# Patient Record
Sex: Female | Born: 1950
Health system: Southern US, Community
[De-identification: ages and names within clinical notes are randomized; demographics above are authoritative.]

## PROBLEM LIST (undated history)

## (undated) DIAGNOSIS — Z8619 Personal history of other infectious and parasitic diseases: Secondary | ICD-10-CM

## (undated) DIAGNOSIS — S92009A Unspecified fracture of unspecified calcaneus, initial encounter for closed fracture: Secondary | ICD-10-CM

## (undated) DIAGNOSIS — D219 Benign neoplasm of connective and other soft tissue, unspecified: Secondary | ICD-10-CM

## (undated) HISTORY — DX: Personal history of other infectious and parasitic diseases: Z86.19

## (undated) HISTORY — DX: Benign neoplasm of connective and other soft tissue, unspecified: D21.9

## (undated) HISTORY — DX: Unspecified fracture of unspecified calcaneus, initial encounter for closed fracture: S92.009A

---

## 1975-03-31 HISTORY — PX: BREAST SURGERY: SHX581

## 1992-03-30 DIAGNOSIS — D219 Benign neoplasm of connective and other soft tissue, unspecified: Secondary | ICD-10-CM

## 1992-03-30 HISTORY — PX: ABDOMINAL HYSTERECTOMY: SHX81

## 1992-03-30 HISTORY — DX: Benign neoplasm of connective and other soft tissue, unspecified: D21.9

## 2000-08-11 ENCOUNTER — Other Ambulatory Visit: Admission: RE | Admit: 2000-08-11 | Discharge: 2000-08-11 | Payer: Self-pay | Admitting: Obstetrics and Gynecology

## 2005-08-31 ENCOUNTER — Emergency Department (HOSPITAL_COMMUNITY): Admission: EM | Admit: 2005-08-31 | Discharge: 2005-08-31 | Payer: Self-pay | Admitting: Emergency Medicine

## 2008-04-18 ENCOUNTER — Ambulatory Visit: Payer: Self-pay | Admitting: Sports Medicine

## 2008-04-18 DIAGNOSIS — M7750 Other enthesopathy of unspecified foot: Secondary | ICD-10-CM

## 2008-04-18 DIAGNOSIS — M2040 Other hammer toe(s) (acquired), unspecified foot: Secondary | ICD-10-CM

## 2010-12-29 HISTORY — PX: COLONOSCOPY: SHX174

## 2010-12-29 LAB — HM COLONOSCOPY: HM Colonoscopy: 3

## 2011-03-31 DIAGNOSIS — S92009A Unspecified fracture of unspecified calcaneus, initial encounter for closed fracture: Secondary | ICD-10-CM

## 2011-03-31 HISTORY — DX: Unspecified fracture of unspecified calcaneus, initial encounter for closed fracture: S92.009A

## 2012-02-03 ENCOUNTER — Ambulatory Visit (INDEPENDENT_AMBULATORY_CARE_PROVIDER_SITE_OTHER): Payer: BC Managed Care – PPO | Admitting: Sports Medicine

## 2012-02-03 ENCOUNTER — Encounter: Payer: Self-pay | Admitting: Sports Medicine

## 2012-02-03 VITALS — BP 124/75 | HR 72 | Ht 63.5 in | Wt 127.0 lb

## 2012-02-03 DIAGNOSIS — M7750 Other enthesopathy of unspecified foot: Secondary | ICD-10-CM

## 2012-02-03 DIAGNOSIS — M7670 Peroneal tendinitis, unspecified leg: Secondary | ICD-10-CM | POA: Insufficient documentation

## 2012-02-03 NOTE — Patient Instructions (Addendum)
Very nice to meet you. I think the compression on the ankle is still a good idea. When you have is peroneal tendinitis. I would like you to do the exercises I am giving you one times daily at least. Icing 20 minutes 3 times a day. Take ibuprofen 600 mg 3 times a day for 5 days. This will decrease inflammation. I'm also giving you a sports insoles with a heel lift. Wear this whenever you can. Come back in 6-8 weeks if we're not having much improvement.

## 2012-02-03 NOTE — Progress Notes (Signed)
History of present illness Very pleasant 61 year old female who is coming in with right ankle pain. Patient states that she did notice this pain after she did a very long 20-40 mile walk in the month of September. She had seem to be improving when she took some time off but then when she started walking more as well as playing tennis on a more regular basis she started having pain again. Patient states that the pain is on the lateral aspect of the ankle and is very sore with certain activities. Patient also noticed it significantly in the morning when she feels like it is very tight. Patient denies any popping cracking or any true injury to the area. Patient denies any numbness but states that sometimes the foot feels weak. No fevers no chills no radiation of the pain. Patient has not had this injury before but has injured this ankle previously.   Past medical history is unremarkable Past surgical history is unremarkable Allergies: No known drug allergies Medications: None Family history is significant for coronary artery disease as well as hypertension. Social history: She does not smoke occasional line with dinner she works as an Pensions consultant. She is married to [employer].   Physical exam Blood pressure 124/75, pulse 72, height 5' 3.5" (1.613 m), weight 127 lb (57.607 kg). General: No apparent distress alert and oriented x3 mood and affect normal Skin: Clean dry intact no signs of infection or rash Respiratory: Patient speaks in full sentences does not appear short of breath Neuro: Cranial nerves II through XII are intact, nervous intact in all extremities and 2+ DTRs. Right ankle exam: Patient has full range of motion of her ankle with no crepitus or pain. Patient though is very tender to palpation over the peroneal tendons on the right side just inferior to the lateral malleolus. There is no signs of subluxation. Patient does not have any effusion or edema in this area. She is neurovascularly intact  distally. Patient has no pain over the lateral and medial malleolus or the base of the fifth metatarsal. Out of 5 strength of the ankle.  Ultrasound was done and interpreted by me today. Patient's ultrasound does show some hypoechoic changes surrounding the peroneal tendons on the right side. There is no true tear or neovascularization occurring within these tendons. The tendon is able to be traced to the insertion with there is no tear or hypoechoic changes. The only area that seems to be inflamed is inferior to the lateral malleolus.

## 2012-02-03 NOTE — Assessment & Plan Note (Addendum)
Patient on clinical exam, history as well as ultrasound today has findings consistent with peroneal tendinitis. Patient's is going to wear a  X-cross body helix compression on her ankle with activity. Patient given home exercise program to do a regular basis. Patient given sports insoles with heel lift to decrease the amount of stress on her peroneals. Patient will take anti-inflammatories on a scheduled basis for the next 5 days then as needed thereafter. Patient will try all of these interventions as well as icing protocol for the next 4 weeks. If for some reason she still has pain at that time she will come back for further evaluation.

## 2012-05-09 ENCOUNTER — Encounter: Payer: Self-pay | Admitting: Obstetrics and Gynecology

## 2012-06-24 ENCOUNTER — Ambulatory Visit: Payer: Self-pay | Admitting: Obstetrics and Gynecology

## 2012-08-05 ENCOUNTER — Ambulatory Visit: Payer: Self-pay | Admitting: Obstetrics and Gynecology

## 2012-08-05 ENCOUNTER — Encounter: Payer: Self-pay | Admitting: *Deleted

## 2012-08-09 ENCOUNTER — Encounter: Payer: Self-pay | Admitting: Obstetrics and Gynecology

## 2012-08-09 ENCOUNTER — Ambulatory Visit (INDEPENDENT_AMBULATORY_CARE_PROVIDER_SITE_OTHER): Payer: BC Managed Care – PPO | Admitting: Obstetrics and Gynecology

## 2012-08-09 VITALS — BP 112/70 | Ht 62.25 in | Wt 125.0 lb

## 2012-08-09 DIAGNOSIS — Z01419 Encounter for gynecological examination (general) (routine) without abnormal findings: Secondary | ICD-10-CM

## 2012-08-09 DIAGNOSIS — Z0000 Encounter for general adult medical examination without abnormal findings: Secondary | ICD-10-CM

## 2012-08-09 LAB — POCT URINALYSIS DIPSTICK
Nitrite, UA: NEGATIVE
Protein, UA: NEGATIVE
pH, UA: 5

## 2012-08-09 LAB — LIPID PANEL
Total CHOL/HDL Ratio: 2.7 Ratio
Triglycerides: 67 mg/dL (ref <150)

## 2012-08-09 MED ORDER — ESTRADIOL 10 MCG VA TABS: 1.0000 | ORAL_TABLET | VAGINAL | Status: DC

## 2012-08-09 NOTE — Patient Instructions (Addendum)

## 2012-08-09 NOTE — Progress Notes (Signed)
62 y.o.  Married  Caucasian female   G2P2 here for annual exam.    Patient's last menstrual period was 03/30/1992.          Sexually active: yes  The current method of family planning is status post hysterectomy.    Exercising: fitness center weight training, tennis, walk 5 days a week Last mammogram:  05/31/12 neg Last pap smear:08/11/00 WNL History of abnormal pap: no Smoking: never Alcohol: 3-4 glasses of wine a week Last colonoscopy:12/29/10 (3) polyps repeat in 5 years Last Bone Density:  Heel scan normal 1993 Last tetanus shot: less than 10 years Last cholesterol check: 04/05/09 total 195  Hgb: 14.5               Urine: neg    Health Maintenance  Topic Date Due  . Pap Smear  07/05/1970  . Tetanus/tdap  07/05/1971  . Mammogram  07/05/2002  . Zostavax  07/04/2012  . Influenza Vaccine  11/28/2012  . Colonoscopy  12/28/2020    Family History  Problem Relation Age of Onset  . Colon cancer Father     age 50  . Hypertension Mother   . Heart disease Mother     Patient Active Problem List   Diagnosis Date Noted  . Peroneal tendonitis 02/03/2012  . METATARSALGIA 04/18/2008  . HAMMER TOE, ACQUIRED 04/18/2008    Past Medical History  Diagnosis Date  . Fibroid 1994     Resolved via TAH  . Heel fracture 2013    left foot    Past Surgical History  Procedure Laterality Date  . Breast surgery  1977    right breast bx, benign  . Abdominal hysterectomy  1994    TAH, fibroids, endometiosis  . Colonoscopy  12/2010    3 polyps recheck 5 years    Allergies: Doxycycline  Current Outpatient Prescriptions  Medication Sig Dispense Refill  . calcium carbonate (OS-CAL) 600 MG TABS Take 600 mg by mouth 2 (two) times daily with a meal.      . cholecalciferol (VITAMIN D) 1000 UNITS tablet Take 1,000 Units by mouth daily.      . Estradiol (VAGIFEM) 10 MCG TABS Place vaginally 2 (two) times a week.      . Multiple Vitamins-Minerals (MULTIVITAMIN & MINERAL PO) Take 1 tablet by  mouth daily.       No current facility-administered medications for this visit.    ROS: Pertinent items are noted in HPI.  Social Hx:  Mother died in 2023-01-01.  Her being sick was very stressful.  Married, two children, Clinical research associate.  Daughter in [place] just had twins.   Exam:    BP 112/70  Ht 5' 2.25" (1.581 m)  Wt 125 lb (56.7 kg)  BMI 22.68 kg/m2  LMP 03/30/1992   Wt Readings from Last 3 Encounters:  08/09/12 125 lb (56.7 kg)  02/03/12 127 lb (57.607 kg)  04/18/08 123 lb (55.792 kg)     Ht Readings from Last 3 Encounters:  08/09/12 5' 2.25" (1.581 m)  02/03/12 5' 3.5" (1.613 m)    General appearance: alert, cooperative and appears stated age Head: Normocephalic, without obvious abnormality, atraumatic Neck: no adenopathy, supple, symmetrical, trachea midline and thyroid not enlarged, symmetric, no tenderness/mass/nodules Lungs: clear to auscultation bilaterally Breasts: Inspection negative, No nipple retraction or dimpling, No nipple discharge or bleeding, No axillary or supraclavicular adenopathy, Normal to palpation without dominant masses Heart: regular rate and rhythm Abdomen: soft, non-tender; bowel sounds normal; no masses,  no organomegaly  Extremities: extremities normal, atraumatic, no cyanosis or edema Skin: Skin color, texture, turgor normal. No rashes or lesions Lymph nodes: Cervical, supraclavicular, and axillary nodes normal. No abnormal inguinal nodes palpated Neurologic: Grossly normal   Pelvic: External genitalia:  no lesions              Urethra:  normal appearing urethra with no masses, tenderness or lesions              Bartholins and Skenes: normal                 Vagina: normal appearing vagina with normal color and discharge, no lesions              Cervix: absent              Pap taken: no        Bimanual Exam:  Uterus:  Surgically absent                                      Adnexa: normal adnexa in size, nontender and no masses                                       Rectovaginal: Confirms                                      Anus:  normal sphincter tone, no lesions  A: normal meno exam, no HRT     P: mammogram counseled on breast self exam, mammography screening, adequate intake of calcium and vitamin D, diet and exercise return annually or prn   Refill vagifem.   An After Visit Summary was printed and given to the patient.

## 2013-08-08 ENCOUNTER — Encounter: Payer: Self-pay | Admitting: Certified Nurse Midwife

## 2013-08-08 ENCOUNTER — Ambulatory Visit (INDEPENDENT_AMBULATORY_CARE_PROVIDER_SITE_OTHER): Payer: BC Managed Care – PPO | Admitting: Certified Nurse Midwife

## 2013-08-08 VITALS — BP 110/64 | HR 68 | Temp 97.7°F | Resp 16 | Ht 62.25 in | Wt 126.0 lb

## 2013-08-08 DIAGNOSIS — N390 Urinary tract infection, site not specified: Secondary | ICD-10-CM

## 2013-08-08 LAB — POCT URINALYSIS DIPSTICK
BILIRUBIN UA: NEGATIVE
Blood, UA: NEGATIVE
Glucose, UA: NEGATIVE
KETONES UA: NEGATIVE
Leukocytes, UA: NEGATIVE
Nitrite, UA: NEGATIVE
PH UA: 5
Protein, UA: NEGATIVE
Urobilinogen, UA: NEGATIVE

## 2013-08-08 MED ORDER — CIPROFLOXACIN HCL 500 MG PO TABS: 500.0000 mg | ORAL_TABLET | Freq: Two times a day (BID) | ORAL | Status: DC

## 2013-08-08 NOTE — Patient Instructions (Signed)
Urinary Tract Infection  Urinary tract infections (UTIs) can develop anywhere along your urinary tract. Your urinary tract is your body's drainage system for removing wastes and extra water. Your urinary tract includes two kidneys, two ureters, a bladder, and a urethra. Your kidneys are a pair of bean-shaped organs. Each kidney is about the size of your fist. They are located below your ribs, one on each side of your spine.  CAUSES  Infections are caused by microbes, which are microscopic organisms, including fungi, viruses, and bacteria. These organisms are so small that they can only be seen through a microscope. Bacteria are the microbes that most commonly cause UTIs.  SYMPTOMS   Symptoms of UTIs may vary by age and gender of the patient and by the location of the infection. Symptoms in young women typically include a frequent and intense urge to urinate and a painful, burning feeling in the bladder or urethra during urination. Older women and men are more likely to be tired, shaky, and weak and have muscle aches and abdominal pain. A fever may mean the infection is in your kidneys. Other symptoms of a kidney infection include pain in your back or sides below the ribs, nausea, and vomiting.  DIAGNOSIS  To diagnose a UTI, your caregiver will ask you about your symptoms. Your caregiver also will ask to provide a urine sample. The urine sample will be tested for bacteria and white blood cells. White blood cells are made by your body to help fight infection.  TREATMENT   Typically, UTIs can be treated with medication. Because most UTIs are caused by a bacterial infection, they usually can be treated with the use of antibiotics. The choice of antibiotic and length of treatment depend on your symptoms and the type of bacteria causing your infection.  HOME CARE INSTRUCTIONS   If you were prescribed antibiotics, take them exactly as your caregiver instructs you. Finish the medication even if you feel better after you  have only taken some of the medication.   Drink enough water and fluids to keep your urine clear or pale yellow.   Avoid caffeine, tea, and carbonated beverages. They tend to irritate your bladder.   Empty your bladder often. Avoid holding urine for long periods of time.   Empty your bladder before and after sexual intercourse.   After a bowel movement, women should cleanse from front to back. Use each tissue only once.  SEEK MEDICAL CARE IF:    You have back pain.   You develop a fever.   Your symptoms do not begin to resolve within 3 days.  SEEK IMMEDIATE MEDICAL CARE IF:    You have severe back pain or lower abdominal pain.   You develop chills.   You have nausea or vomiting.   You have continued burning or discomfort with urination.  MAKE SURE YOU:    Understand these instructions.   Will watch your condition.   Will get help right away if you are not doing well or get worse.  Document Released: 12/24/2004 Document Revised: 09/15/2011 Document Reviewed: 04/24/2011  Exit[employer] Patient Information 2014 [employer].

## 2013-08-08 NOTE — Progress Notes (Signed)
63 yo married white female g2p2002 here with complaint of UTI, with onset  on 2 days ago. Patient complaining of urinary frequency/urgency/ and pain/pressure with urination. Patient denies fever, chills, nausea or back pain. No new personal products. Patient feels not to sexual activity. Denies any vaginal symptoms or new personal products.  Menopausal with vaginal dryness previous use of Vagifem use. Using only prn now. Walking in [place] 40 mile support for breast cancer in 3 days. No other health issues.   O: Healthy female WDWN Affect: Normal, orientation x 3 Skin : warm and dry CVAT: negative bilateral Abdomen: positive for suprapubic tenderness  Pelvic exam: External genital area: normal, no lesions Bladder,Urethra, Urethral meatus: tender, and meatus red. Vagina: normal vaginal discharge,slight atrophicl appearance  Wet prep: not taken Cervix:not present Uterus:not present Adnexa: normal non tender, no fullness or masses   A: UTI Atrophic vaginitis Poct urine-neg  P: Reviewed findings of UTI AP:OLIDC see Rx VUD:THYHO micro, culture Reviewed warning signs and symptoms of UTI Encouraged to limit soda, tea, and coffee Discussed need to restart Vagifem due to vaginal findings and also can increase risk of UTI. Patient has Rx and will restart. Questions addressed.  RV 2 week if TOC needed for positive culture  RV prn

## 2013-08-09 LAB — URINALYSIS, MICROSCOPIC ONLY
Bacteria, UA: NONE SEEN
CRYSTALS: NONE SEEN
Casts: NONE SEEN
Squamous Epithelial / LPF: NONE SEEN

## 2013-08-09 LAB — URINE CULTURE
COLONY COUNT: NO GROWTH
Organism ID, Bacteria: NO GROWTH

## 2013-08-10 NOTE — Progress Notes (Signed)
Reviewed personally.  M. Suzanne Taite Schoeppner, MD.  

## 2013-08-11 ENCOUNTER — Ambulatory Visit: Payer: BC Managed Care – PPO | Admitting: Obstetrics and Gynecology

## 2013-08-25 ENCOUNTER — Encounter: Payer: Self-pay | Admitting: Obstetrics and Gynecology

## 2013-08-25 ENCOUNTER — Ambulatory Visit (INDEPENDENT_AMBULATORY_CARE_PROVIDER_SITE_OTHER): Payer: BC Managed Care – PPO | Admitting: Obstetrics and Gynecology

## 2013-08-25 VITALS — BP 120/70 | HR 68 | Ht 62.5 in | Wt 128.6 lb

## 2013-08-25 DIAGNOSIS — Z1211 Encounter for screening for malignant neoplasm of colon: Secondary | ICD-10-CM

## 2013-08-25 DIAGNOSIS — Z0000 Encounter for general adult medical examination without abnormal findings: Secondary | ICD-10-CM

## 2013-08-25 DIAGNOSIS — Z01419 Encounter for gynecological examination (general) (routine) without abnormal findings: Secondary | ICD-10-CM

## 2013-08-25 LAB — COMPREHENSIVE METABOLIC PANEL
ALK PHOS: 52 U/L (ref 39–117)
ALT: 15 U/L (ref 0–35)
AST: 20 U/L (ref 0–37)
Albumin: 4.3 g/dL (ref 3.5–5.2)
BUN: 12 mg/dL (ref 6–23)
CALCIUM: 8.8 mg/dL (ref 8.4–10.5)
CO2: 27 mEq/L (ref 19–32)
CREATININE: 0.55 mg/dL (ref 0.50–1.10)
Chloride: 100 mEq/L (ref 96–112)
GLUCOSE: 83 mg/dL (ref 70–99)
POTASSIUM: 4 meq/L (ref 3.5–5.3)
SODIUM: 136 meq/L (ref 135–145)
Total Bilirubin: 0.5 mg/dL (ref 0.2–1.2)
Total Protein: 6.2 g/dL (ref 6.0–8.3)

## 2013-08-25 LAB — CBC
HEMATOCRIT: 42.9 % (ref 36.0–46.0)
Hemoglobin: 14.9 g/dL (ref 12.0–15.0)
MCH: 34.4 pg — ABNORMAL HIGH (ref 26.0–34.0)
MCHC: 34.7 g/dL (ref 30.0–36.0)
MCV: 99.1 fL (ref 78.0–100.0)
Platelets: 211 10*3/uL (ref 150–400)
RBC: 4.33 MIL/uL (ref 3.87–5.11)
RDW: 13.3 % (ref 11.5–15.5)
WBC: 4.2 10*3/uL (ref 4.0–10.5)

## 2013-08-25 LAB — POCT URINALYSIS DIPSTICK
BILIRUBIN UA: NEGATIVE
Blood, UA: NEGATIVE
GLUCOSE UA: NEGATIVE
KETONES UA: NEGATIVE
Leukocytes, UA: NEGATIVE
Nitrite, UA: NEGATIVE
PROTEIN UA: NEGATIVE
Urobilinogen, UA: NEGATIVE
pH, UA: 5

## 2013-08-25 LAB — LIPID PANEL
CHOLESTEROL: 180 mg/dL (ref 0–200)
HDL: 72 mg/dL (ref >39)
LDL Cholesterol: 91 mg/dL (ref 0–99)
Total CHOL/HDL Ratio: 2.5 Ratio
Triglycerides: 87 mg/dL (ref <150)
VLDL: 17 mg/dL (ref 0–40)

## 2013-08-25 LAB — HEMOGLOBIN, FINGERSTICK: Hemoglobin, fingerstick: 14.6 g/dL (ref 12.0–16.0)

## 2013-08-25 MED ORDER — ESTRADIOL 10 MCG VA TABS: 1.0000 | ORAL_TABLET | VAGINAL | Status: DC

## 2013-08-25 NOTE — Patient Instructions (Signed)

## 2013-08-25 NOTE — Progress Notes (Signed)
Patient ID: Morgan Salinas, female   DOB: 1950/07/15, 63 y.o.   MRN: [id number] GYNECOLOGY VISIT  PCP:   None  Referring provider:   HPI: 63 y.o.   Married  Caucasian  female   G2P2 with Patient's last menstrual period was 03/30/1992.   here for   AEX. Weight up a little bit.   Using Vagifem.   Exercises a lot.  Taking MVI and Calcium supplement.   Going to [place] for international female tennis tournament.   Mother passed at age 64 in 2013 - failure to thrive.   Hgb:     Urine:  Neg  GYNECOLOGIC HISTORY: Patient's last menstrual period was 03/30/1992. Sexually active: yes  Partner preference: female Contraception:  Hysterectomy  Menopausal hormone therapy: Vagifem DES exposure:  no  Blood transfusions:  no Sexually transmitted diseases: no  GYN procedures and prior surgeries:  TAH for fibroids, endometriosis 1994 Last mammogram:   05/2012 NIO:[id number] for 09-20-13.              Last pap and high risk HPV testing:  08-11-00 wnl  History of abnormal pap smear:  no   OB History   Grav Para Term Preterm Abortions TAB SAB Ect Mult Living   _0 LIFESTYLE: Exercise:   Walking and tennis            Tobacco:   no Alcohol:     4 beers/wine per week Drug use:  no  OTHER HEALTH MAINTENANCE: Tetanus/TDap:   2011 Gardisil:              n/a Influenza:            never Zostavax:            no  Bone density:     never Colonoscopy:     2012 revealed polyps with Dr. Collene Mares.  Next colonoscopy 2017 due to family history of colon cancer in father and colon polyps.  Cholesterol check:  2007 wnl  Family History  Problem Relation Age of Onset  . Colon cancer Father     age 30  . Hypertension Mother   . Heart disease Mother     Patient Active Problem List   Diagnosis Date Noted  . Peroneal tendonitis 02/03/2012  . METATARSALGIA 04/18/2008  . HAMMER TOE, ACQUIRED 04/18/2008   Past Medical History  Diagnosis Date  . Fibroid 1994     Resolved via TAH  .  Heel fracture 2013    left foot    Past Surgical History  Procedure Laterality Date  . Breast surgery  1977    right breast bx, benign  . Abdominal hysterectomy  1994    TAH, fibroids, endometiosis  . Colonoscopy  12/2010    3 polyps recheck 5 years    ALLERGIES: Doxycycline  Current Outpatient Prescriptions  Medication Sig Dispense Refill  . Estradiol 10 MCG TABS vaginal tablet Place 1 tablet vaginally 2 (two) times a week. occ       No current facility-administered medications for this visit.     ROS:  Pertinent items are noted in HPI.  SOCIAL HISTORY:  Attorney  PHYSICAL EXAMINATION:    BP 120/70  Pulse 68  Ht 5' 2.5" (1.588 m)  Wt 128 lb 9.6 oz (58.333 kg)  BMI 23.13 kg/m2  LMP 03/30/1992   Wt Readings from Last 3 Encounters:  08/25/13 128 lb 9.6 oz (58.333  kg)  08/08/13 126 lb (57.153 kg)  08/09/12 125 lb (56.7 kg)     Ht Readings from Last 3 Encounters:  08/25/13 5' 2.5" (1.588 m)  08/08/13 5' 2.25" (1.581 m)  08/09/12 5' 2.25" (1.581 m)    General appearance: alert, cooperative and appears stated age Head: Normocephalic, without obvious abnormality, atraumatic Neck: no adenopathy, supple, symmetrical, trachea midline and thyroid not enlarged, symmetric, no tenderness/mass/nodules Lungs: clear to auscultation bilaterally Breasts: Inspection negative, No nipple retraction or dimpling, No nipple discharge or bleeding, No axillary or supraclavicular adenopathy, Normal to palpation without dominant masses Heart: regular rate and rhythm Abdomen: Pfannenstiel, soft, non-tender; no masses,  no organomegaly Extremities: extremities normal, atraumatic, no cyanosis or edema Skin: Skin color, texture, turgor normal. No rashes or lesions Lymph nodes: Cervical, supraclavicular, and axillary nodes normal. No abnormal inguinal nodes palpated Neurologic: Grossly normal  Pelvic: External genitalia:  no lesions              Urethra:  normal appearing urethra with no  masses, tenderness or lesions              Bartholins and Skenes: normal                 Vagina: normal appearing vagina with normal color and discharge, no lesions              Cervix:  absent              Pap and high risk HPV testing done: no.            Bimanual Exam:  Uterus:  uterus is  absent                                      Adnexa: normal adnexa in size, nontender and no masses                                      Rectovaginal: Confirms                                      Anus:  normal sphincter tone, no lesions  ASSESSMENT  Normal gynecologic exam. Status post TAH. Family history of colon cancer.  PLAN  Mammogram recommended yearly - will do 3D. Pap smear and high risk HPV testing not indicated. Counseled on self breast exam, Calcium and vitamin D intake, exercise. FLP, CMP, CBC, TSH, IFOB kit. Refill of Vagifem for one year.  Discussed risk of DVT, PE, MI, stroke, breast cancer.  Return annually or prn   An After Visit Summary was printed and given to the patient.             

## 2013-08-26 LAB — TSH: TSH: 1.88 u[IU]/mL (ref 0.350–4.500)

## 2013-11-24 ENCOUNTER — Encounter: Payer: Self-pay | Admitting: Obstetrics and Gynecology

## 2014-01-29 ENCOUNTER — Encounter: Payer: Self-pay | Admitting: Obstetrics and Gynecology

## 2014-08-31 ENCOUNTER — Ambulatory Visit: Payer: BC Managed Care – PPO | Admitting: Obstetrics and Gynecology

## 2014-08-31 ENCOUNTER — Ambulatory Visit (INDEPENDENT_AMBULATORY_CARE_PROVIDER_SITE_OTHER): Payer: No Typology Code available for payment source | Admitting: Obstetrics and Gynecology

## 2014-08-31 ENCOUNTER — Encounter: Payer: Self-pay | Admitting: Obstetrics and Gynecology

## 2014-08-31 VITALS — BP 130/82 | HR 80 | Resp 16 | Ht 62.5 in | Wt 124.0 lb

## 2014-08-31 DIAGNOSIS — Z0000 Encounter for general adult medical examination without abnormal findings: Secondary | ICD-10-CM | POA: Diagnosis not present

## 2014-08-31 DIAGNOSIS — Z01419 Encounter for gynecological examination (general) (routine) without abnormal findings: Secondary | ICD-10-CM

## 2014-08-31 LAB — POCT URINALYSIS DIPSTICK
BILIRUBIN UA: NEGATIVE
Blood, UA: NEGATIVE
Glucose, UA: NEGATIVE
KETONES UA: NEGATIVE
LEUKOCYTES UA: NEGATIVE
NITRITE UA: NEGATIVE
PH UA: 6.5
PROTEIN UA: NEGATIVE
Spec Grav, UA: 1.02
UROBILINOGEN UA: NEGATIVE

## 2014-08-31 MED ORDER — ESTRADIOL 10 MCG VA TABS: ORAL_TABLET | VAGINAL | Status: DC

## 2014-08-31 NOTE — Patient Instructions (Signed)

## 2014-08-31 NOTE — Progress Notes (Signed)
Patient ID: Morgan Salinas, female   DOB: [birth year], 64 y.o.   MRN: [id number] 64 y.o. G2P2 Married Caucasian female here for annual exam.    Using vaginal estrogen.  Wants to continue.  Not very sexually active.  Grandson born at [redacted] weeks gestation in [place].  Working in the yard more and working out less.  Healthy diet with vegetables.   PCP:   No PCP  Patient's last menstrual period was 03/30/1992.          Sexually active: No.  The current method of family planning is status post hysterectomy.    Exercising: Yes.    walking, tennis weights sking Smoker:  no  Health Maintenance: Pap:  08-11-2000 WNL TAH for fibroids, endometriosis  History of abnormal Pap:  no MMG:  09-20-13 extremely dense [facility].   Colonoscopy:  12-29-10 polyps in 5 years BMD:   1993? Pt unsure of date or results TDaP: with-in 10 yrs per patient Screening Labs:  Drawn today Hb today: 14.7, Urine today: WNL   reports that she has never smoked. She has never used smokeless tobacco. She reports that she drinks about 2.0 oz of alcohol per week. She reports that she does not use illicit drugs.  Past Medical History  Diagnosis Date  . Fibroid 1994     Resolved via TAH  . Heel fracture 2013    left foot    Past Surgical History  Procedure Laterality Date  . Breast surgery  1977    right breast bx, benign  . Abdominal hysterectomy  1994    TAH, fibroids, endometiosis  . Colonoscopy  12/2010    3 polyps recheck 5 years    Current Outpatient Prescriptions  Medication Sig Dispense Refill  . Estradiol 10 MCG TABS vaginal tablet Place 1 tablet (10 mcg total) vaginally 2 (two) times a week. occ 24 tablet 3   No current facility-administered medications for this visit.    Family History  Problem Relation Age of Onset  . Colon cancer Father     age 4  . Hypertension Mother   . Heart disease Mother     ROS:  Pertinent items are noted in HPI.  Otherwise, a comprehensive ROS was negative.  Exam:    BP 130/82 mmHg  Pulse 80  Resp 16  Ht 5' 2.5" (1.588 m)  Wt 124 lb (56.246 kg)  BMI 22.30 kg/m2  LMP 03/30/1992    General appearance: alert, cooperative and appears stated age Head: Normocephalic, without obvious abnormality, atraumatic Neck: no adenopathy, supple, symmetrical, trachea midline and thyroid normal to inspection and palpation Lungs: clear to auscultation bilaterally Breasts: normal appearance, no masses or tenderness, Inspection negative, No nipple retraction or dimpling, No nipple discharge or bleeding, No axillary or supraclavicular adenopathy Heart: regular rate and rhythm Abdomen: soft, non-tender; bowel sounds normal; no masses,  no organomegaly Extremities: extremities normal, atraumatic, no cyanosis or edema Skin: Skin color, texture, turgor normal. No rashes or lesions Lymph nodes: Cervical, supraclavicular, and axillary nodes normal. No abnormal inguinal nodes palpated Neurologic: Grossly normal  Pelvic: External genitalia:  no lesions              Urethra:  normal appearing urethra with no masses, tenderness or lesions              Bartholins and Skenes: normal                 Vagina:  Atrophic changes noted.  No lesions.  Cervix: absent              Pap taken: No. Bimanual Exam:  Uterus:  uterus absent              Adnexa: normal adnexa and no mass, fullness, tenderness              Rectovaginal: Yes.  .  Confirms.              Anus:  normal sphincter tone, no lesions  Chaperone was present for exam.  Assessment:   Well woman visit with normal exam. Status post TAH for endometriosis and fibroids.  Menopausal female. Hx of left heel fracture. Atrophic vaginal changes.  Plan: Yearly mammogram recommended after age 82. Discussed 3D.  Patient will discuss cost with [facility]. Patient [facility].  We will fax order is so she can schedule. Recommended self breast exam.  Pap and HR HPV as above. Discussed Calcium, Vitamin  D, regular exercise program including cardiovascular and weight bearing exercise. Labs performed.   Yes.   See orders.  Routine labs done.  Refills given on medications.  Yes.  .  See orders for Vagifem.  Reviewed risks of DVT, PE, MI, stroke, and breast cancer.  Follow up annually and prn.      After visit summary provided.

## 2014-09-03 LAB — LIPID PANEL
CHOL/HDL RATIO: 2.6 ratio
CHOLESTEROL: 220 mg/dL — AB (ref 0–200)
HDL: 84 mg/dL (ref >=46)
LDL CALC: 112 mg/dL — AB (ref 0–99)
Triglycerides: 120 mg/dL (ref <150)
VLDL: 24 mg/dL (ref 0–40)

## 2014-09-03 LAB — COMPREHENSIVE METABOLIC PANEL
ALK PHOS: 56 U/L (ref 39–117)
ALT: 12 U/L (ref 0–35)
AST: 14 U/L (ref 0–37)
Albumin: 4.5 g/dL (ref 3.5–5.2)
BILIRUBIN TOTAL: 0.8 mg/dL (ref 0.2–1.2)
BUN: 10 mg/dL (ref 6–23)
CALCIUM: 9.5 mg/dL (ref 8.4–10.5)
CO2: 28 mEq/L (ref 19–32)
Chloride: 98 mEq/L (ref 96–112)
Creat: 0.68 mg/dL (ref 0.50–1.10)
GLUCOSE: 90 mg/dL (ref 70–99)
Potassium: 3.7 mEq/L (ref 3.5–5.3)
Sodium: 136 mEq/L (ref 135–145)
TOTAL PROTEIN: 6.8 g/dL (ref 6.0–8.3)

## 2014-09-03 LAB — TSH: TSH: 1.938 u[IU]/mL (ref 0.350–4.500)

## 2014-09-03 LAB — HEMOGLOBIN, FINGERSTICK: Hemoglobin, fingerstick: 14.7 g/dL (ref 12.0–16.0)

## 2014-09-03 NOTE — Addendum Note (Signed)
Addended by: Yisroel Ramming, BROOK E on: 09/03/2014 11:14 AM   Modules accepted: Orders

## 2014-09-04 LAB — VITAMIN D 25 HYDROXY (VIT D DEFICIENCY, FRACTURES): VIT D 25 HYDROXY: 37 ng/mL (ref 30–100)

## 2014-09-21 ENCOUNTER — Telehealth: Payer: Self-pay | Admitting: Obstetrics and Gynecology

## 2014-09-21 NOTE — Telephone Encounter (Signed)
Estrace cream, Premarin cream, or Estring would be of similar dosage.  Patient can check the pricing with her pharmacy and let me know what she would like to pursue.  Savings cards may reduce cost further.   I am happy to change her medication after she lets me know what she prefers.

## 2014-09-21 NOTE — Telephone Encounter (Signed)
Per review of patients formulary. Vagifem is a tier 2 medication. Only tier 1 medications are oral Estradiol and Climara patch. Estring is also tier 2. Estrace cream, Premarin, and Estring are all tier 3. Savings card for Estrace is available but unsure how much cheaper this would be than her current tier 2 medication. Please review and advised further recommendations.

## 2014-09-21 NOTE — Telephone Encounter (Signed)
Spoke with patient. Advised of message as seen below from [facility]. Patient is agreeable. Will contact her insurance company regarding cost and return call.

## 2014-09-21 NOTE — Telephone Encounter (Signed)
Patient states Dr Quincy Simmonds prescribed vagifem and it is too expensive even with insurance. Patient requesting call to discuss alternatives.  Pharmacy: [facility] [address]

## 2014-10-03 ENCOUNTER — Telehealth: Payer: Self-pay

## 2014-10-03 NOTE — Telephone Encounter (Signed)
Called patient at [phone] to discuss normal results of Bone Density Study per Dr. Quincy Simmonds.  Left detailed message on voicemail, per DPR, stating BMD normal. Copy of report from [facility] to be scanned into Epic.

## 2015-09-27 ENCOUNTER — Ambulatory Visit: Payer: No Typology Code available for payment source | Admitting: Obstetrics and Gynecology

## 2015-10-16 ENCOUNTER — Encounter: Payer: Self-pay | Admitting: Obstetrics and Gynecology

## 2015-11-14 ENCOUNTER — Encounter: Payer: Self-pay | Admitting: Obstetrics and Gynecology

## 2015-11-14 ENCOUNTER — Ambulatory Visit (INDEPENDENT_AMBULATORY_CARE_PROVIDER_SITE_OTHER): Payer: 59 | Admitting: Obstetrics and Gynecology

## 2015-11-14 VITALS — BP 122/76 | HR 64 | Resp 16 | Ht 62.0 in | Wt 121.6 lb

## 2015-11-14 DIAGNOSIS — Z23 Encounter for immunization: Secondary | ICD-10-CM

## 2015-11-14 DIAGNOSIS — Z01419 Encounter for gynecological examination (general) (routine) without abnormal findings: Secondary | ICD-10-CM | POA: Diagnosis not present

## 2015-11-14 MED ORDER — ESTRADIOL 10 MCG VA TABS: ORAL_TABLET | VAGINAL | 11 refills | Status: DC

## 2015-11-14 NOTE — Patient Instructions (Signed)

## 2015-11-14 NOTE — Progress Notes (Signed)
65 y.o. G2P2 Married Caucasian female here for annual exam.    Not using Vagifem for some months. Costly.  Walking.  Playing tennis.   PCP:   None  Patient's last menstrual period was 03/30/1992.           Sexually active: No. husband The current method of family planning is status post hysterectomy.    Exercising: Yes.    Walking, tennis, weights Smoker:  no  Health Maintenance: Pap:  2002 normal History of abnormal Pap:  no MMG:  10-11-15 Density D/Neg/BiRads1:[facility] Colonoscopy:  12-29-10 polyps;next due 12/2015 BMD:   09-25-14 Result  Normal:[facility] TDaP: ?10 years Gardasil:   N/A HIV:  With PCP. Hep C:  With PCP. Screening Labs:   Urine today: Unable to void   reports that she has never smoked. She has never used smokeless tobacco. She reports that she drinks about 3.6 oz of alcohol per week . She reports that she does not use drugs.  Past Medical History:  Diagnosis Date  . Fibroid 1994    Resolved via TAH  . Heel fracture 2013   left foot    Past Surgical History:  Procedure Laterality Date  . ABDOMINAL HYSTERECTOMY  1994   TAH, fibroids, endometiosis  . BREAST SURGERY  1977   right breast bx, benign  . COLONOSCOPY  12/2010   3 polyps recheck 5 years    Current Outpatient Prescriptions  Medication Sig Dispense Refill  . Estradiol 10 MCG TABS vaginal tablet Place one tablet (10 mcg) in the vagina at bedtime nightly twice a week. (Patient not taking: Reported on 11/14/2015) 24 tablet 3   No current facility-administered medications for this visit.     Family History  Problem Relation Age of Onset  . Colon cancer Father     age 2  . Hypertension Mother   . Heart disease Mother     ROS:  Pertinent items are noted in HPI.  Otherwise, a comprehensive ROS was negative.  Exam:   BP 122/76 (BP Location: Right Arm, Patient Position: Sitting, Cuff Size: Small)   Pulse 64   Resp 16   Ht 5\' 2"  (1.575 m)   Wt 121 lb 9.6 oz (55.2 kg)   LMP 03/30/1992   BMI  22.24 kg/m     General appearance: alert, cooperative and appears stated age Head: Normocephalic, without obvious abnormality, atraumatic Neck: no adenopathy, supple, symmetrical, trachea midline and thyroid normal to inspection and palpation Lungs: clear to auscultation bilaterally Breasts: normal appearance, no masses or tenderness, No nipple retraction or dimpling, No nipple discharge or bleeding, No axillary or supraclavicular adenopathy Heart: regular rate and rhythm Abdomen: soft, non-tender; no masses, no organomegaly Extremities: extremities normal, atraumatic, no cyanosis or edema Skin: Skin color, texture, turgor normal. No rashes or lesions Lymph nodes: Cervical, supraclavicular, and axillary nodes normal. No abnormal inguinal nodes palpated Neurologic: Grossly normal  Pelvic: External genitalia:  no lesions              Urethra:  normal appearing urethra with no masses, tenderness or lesions              Bartholins and Skenes: normal                 Vagina: normal appearing vagina with normal color and discharge, no lesions.  Atrophy noted.              Cervix:  absent  Pap taken: no. Bimanual Exam:  Uterus:  Absent.              Adnexa: no mass, fullness, tenderness              Rectal exam: Yes.  .  Confirms.              Anus:  normal sphincter tone, no lesions  Chaperone was present for exam.  Assessment:   Well woman visit with normal exam. Status post TAH for endometriosis and fibroids. Ovaries remain.  Menopausal female. Hx of left heel fracture. Atrophic vaginal changes.  Plan: Yearly mammogram recommended after age 40.  Recommended self breast exam.  Pap and HR HPV as above. Guidelines for Calcium, Vitamin D, regular exercise program including cardiovascular and weight bearing exercise. Rx for Vagifem generic.  Discussed risks of DVT, PE, MI, stroke, and breast cancer.  Discussed Estring as an alternative.  TDap today.  Labs with new PCP.   Follow up annually and prn.      After visit summary provided.

## 2015-11-25 ENCOUNTER — Telehealth: Payer: Self-pay | Admitting: Obstetrics and Gynecology

## 2015-11-25 NOTE — Telephone Encounter (Signed)
Patient is asking if her Estradiol could be changed to an oral Estradiol. Confirmed pharmacy with patient.

## 2015-11-25 NOTE — Telephone Encounter (Signed)
Had annual exam on 11-14-15 with Dr Quincy Simmonds. Patient states Vagifem is very expensive and she was wondering about switching to oral tablets. Advised she may need to discuss with Dr Quincy Simmonds the pros/cons of oral estrogen since this would be more systemic.  Patient declines this for now, had not thought of this and does not want to make this change due to history of breast cysts.  Advised to check preferred med list for insurance company plan and see if there is a vaginal estrogen that has better coverage than another.  Patient will consider these options and weigh cost of Vagifem and let us know if she wants any change. For now, no changes made.  Routing to provider for final review. Patient agreeable to disposition. Will close encounter. Routed to Dr Talbert Nan while Dr Quincy Simmonds out of office.

## 2015-11-26 NOTE — Telephone Encounter (Signed)
She should also check with her pharmacist about the generic version of the vagifem.

## 2015-11-26 NOTE — Telephone Encounter (Addendum)
Returned call to patient to give message as seen below by Dr. Talbert Nan. Patient states she is going to "look in to it this week." Patient states, "Gay Filler was very helpful yesterday, and I may resort to just filling the prescription. It just came at a bad time to spend $300 back to back." RN advised patient if she had any questions or concerns to call the office. Patient agreeable and appreciative of call.  Routing to provider for final review. Patient agreeable to disposition. Will close encounter.    cc Dr. Quincy Simmonds

## 2016-10-05 ENCOUNTER — Telehealth: Payer: Self-pay | Admitting: Obstetrics and Gynecology

## 2016-10-05 NOTE — Telephone Encounter (Signed)
Left message regarding upcoming appointment has been canceled and needs to be rescheduled. °

## 2016-12-04 ENCOUNTER — Ambulatory Visit: Payer: 59 | Admitting: Obstetrics and Gynecology

## 2016-12-09 NOTE — Progress Notes (Deleted)
66 y.o. G2P2 Married Caucasian female here for annual exam.    PCP:     Patient's last menstrual period was 03/30/1992.           Sexually active: {yes no:314532}  The current method of family planning is status post hysterectomy.    Exercising: {yes no:314532}  {types:19826} Smoker:  no  Health Maintenance: Pap: 2002 normal History of abnormal Pap:  no MMG: 10-11-15 Density D/Neg/BiRads1:[facility]--Has Appt.12-31-16  Colonoscopy: ***12-29-10 polyps;next due 12/2015 BMD:   09-25-14  Result Normal:[facility] TDaP:  11-14-15 Gardasil:   no QMG:QQPY PCP Hep C:with PCP Screening Labs:  Hb today: ***, Urine today: ***   reports that she has never smoked. She has never used smokeless tobacco. She reports that she drinks about 3.6 oz of alcohol per week . She reports that she does not use drugs.  Past Medical History:  Diagnosis Date  . Fibroid 1994    Resolved via TAH  . Heel fracture 2013   left foot    Past Surgical History:  Procedure Laterality Date  . ABDOMINAL HYSTERECTOMY  1994   TAH, fibroids, endometiosis  . BREAST SURGERY  1977   right breast bx, benign  . COLONOSCOPY  12/2010   3 polyps recheck 5 years    Current Outpatient Prescriptions  Medication Sig Dispense Refill  . Estradiol 10 MCG TABS vaginal tablet Place one tablet (10 mcg) in the vagina at bedtime nightly for 2 weeks and then place one tablet in vagina twice a week. 18 tablet 11   No current facility-administered medications for this visit.     Family History  Problem Relation Age of Onset  . Colon cancer Father        age 81  . Hypertension Mother   . Heart disease Mother     ROS:  Pertinent items are noted in HPI.  Otherwise, a comprehensive ROS was negative.  Exam:   LMP 03/30/1992     General appearance: alert, cooperative and appears stated age Head: Normocephalic, without obvious abnormality, atraumatic Neck: no adenopathy, supple, symmetrical, trachea midline and thyroid normal to inspection  and palpation Lungs: clear to auscultation bilaterally Breasts: normal appearance, no masses or tenderness, No nipple retraction or dimpling, No nipple discharge or bleeding, No axillary or supraclavicular adenopathy Heart: regular rate and rhythm Abdomen: soft, non-tender; no masses, no organomegaly Extremities: extremities normal, atraumatic, no cyanosis or edema Skin: Skin color, texture, turgor normal. No rashes or lesions Lymph nodes: Cervical, supraclavicular, and axillary nodes normal. No abnormal inguinal nodes palpated Neurologic: Grossly normal  Pelvic: External genitalia:  no lesions              Urethra:  normal appearing urethra with no masses, tenderness or lesions              Bartholins and Skenes: normal                 Vagina: normal appearing vagina with normal color and discharge, no lesions              Cervix: no lesions              Pap taken: {yes no:314532} Bimanual Exam:  Uterus:  normal size, contour, position, consistency, mobility, non-tender              Adnexa: no mass, fullness, tenderness              Rectal exam: {yes no:314532}.  Confirms.  Anus:  normal sphincter tone, no lesions  Chaperone was present for exam.  Assessment:   Well woman visit with normal exam.   Plan: Mammogram screening discussed. Recommended self breast awareness. Pap and HR HPV as above. Guidelines for Calcium, Vitamin D, regular exercise program including cardiovascular and weight bearing exercise.   Follow up annually and prn.   Additional counseling given.  {yes [id number]. _______ minutes face to face time of which over 50% was spent in counseling.    After visit summary provided.

## 2016-12-14 ENCOUNTER — Ambulatory Visit: Payer: 59 | Admitting: Obstetrics and Gynecology

## 2017-01-04 ENCOUNTER — Encounter: Payer: Self-pay | Admitting: Obstetrics and Gynecology

## 2017-01-04 ENCOUNTER — Ambulatory Visit (INDEPENDENT_AMBULATORY_CARE_PROVIDER_SITE_OTHER): Payer: 59 | Admitting: Obstetrics and Gynecology

## 2017-01-04 VITALS — BP 118/74 | HR 70 | Resp 14 | Ht 62.0 in | Wt 127.0 lb

## 2017-01-04 DIAGNOSIS — Z01419 Encounter for gynecological examination (general) (routine) without abnormal findings: Secondary | ICD-10-CM

## 2017-01-04 MED ORDER — ESTRADIOL 10 MCG VA TABS: ORAL_TABLET | VAGINAL | 11 refills | Status: DC

## 2017-01-04 NOTE — Patient Instructions (Signed)

## 2017-01-04 NOTE — Progress Notes (Signed)
66 y.o. G2P2 Married Caucasian female here for annual exam.    Not using Vagifem tablets. Does want refills.   ROS - negative.  SOC - doing a 60 mild walk soon.  Husband having neuropathy of undetermined etiology.   PCP: None  Patient's last menstrual period was 03/30/1992.           Sexually active: No. female The current method of family planning is status post hysterectomy.    Exercising: Yes.    Walking, tennis, running and weights Smoker:  no  Health Maintenance: Pap: 2002 Neg History of abnormal Pap:  no MMG: 12-31-16 Dense/Neg per pt:[facility] Colonoscopy:  04-02-16 polyps with Dr.Mann;next due 3-5 years. BMD: 09-25-14  Result  Normal:[facility] TDaP:  11-14-15 Gardasil:   no HIV: with PCP Hep C:with PCP Screening Labs:  Urine today: not done   reports that she has never smoked. She has never used smokeless tobacco. She reports that she drinks about 4.2 - 4.8 oz of alcohol per week . She reports that she does not use drugs.  Past Medical History:  Diagnosis Date  . Fibroid 1994    Resolved via TAH  . Heel fracture 2013   left foot    Past Surgical History:  Procedure Laterality Date  . ABDOMINAL HYSTERECTOMY  1994   TAH, fibroids, endometiosis  . BREAST SURGERY  1977   right breast bx, benign  . COLONOSCOPY  12/2010   3 polyps recheck 5 years    Current Outpatient Prescriptions  Medication Sig Dispense Refill  . Estradiol 10 MCG TABS vaginal tablet Place one tablet (10 mcg) in the vagina at bedtime nightly for 2 weeks and then place one tablet in vagina twice a week. (Patient not taking: Reported on 01/04/2017) 18 tablet 11   No current facility-administered medications for this visit.     Family History  Problem Relation Age of Onset  . Colon cancer Father        age 60  . Hypertension Mother   . Heart disease Mother     ROS:  Pertinent items are noted in HPI.  Otherwise, a comprehensive ROS was negative.  Exam:   BP 118/74 (BP Location: Right Arm, Patient  Position: Sitting, Cuff Size: Normal)   Pulse 70   Resp 14   Ht 5\' 2"  (1.575 m)   Wt 127 lb (57.6 kg)   LMP 03/30/1992   BMI 23.23 kg/m     General appearance: alert, cooperative and appears stated age Head: Normocephalic, without obvious abnormality, atraumatic Neck: no adenopathy, supple, symmetrical, trachea midline and thyroid normal to inspection and palpation Lungs: clear to auscultation bilaterally Breasts: normal appearance, no masses or tenderness, No nipple retraction or dimpling, No nipple discharge or bleeding, No axillary or supraclavicular adenopathy Heart: regular rate and rhythm Abdomen: soft, non-tender; no masses, no organomegaly Extremities: extremities normal, atraumatic, no cyanosis or edema Skin: Skin color, texture, turgor normal. No rashes or lesions Lymph nodes: Cervical, supraclavicular, and axillary nodes normal. No abnormal inguinal nodes palpated Neurologic: Grossly normal  Pelvic: External genitalia:  no lesions              Urethra:  normal appearing urethra with no masses, tenderness or lesions              Bartholins and Skenes: normal                 Vagina: normal appearing vagina with normal color and discharge, no lesions  Cervix: absent.               Pap taken: No. Bimanual Exam:  Uterus:   Absent.              Adnexa: no mass, fullness, tenderness              Rectal exam: Yes.  .  Confirms.              Anus:  normal sphincter tone, no lesions  Chaperone was present for exam.  Assessment:   Well woman visit with normal exam. Status post TAH for endometriosis and fibroids. Ovaries remain.  Menopausal female. Atrophic vaginal changes.  Plan: Mammogram screening discussed.  We will get report.  Pap and HR HPV as above. Guidelines for Calcium, Vitamin D, regular exercise program including cardiovascular and weight bearing exercise. Refill of Vagifem.  Discussed potential increased risk of breast cancer.  Labs with new PCP.   List of New PCPs to patient.  Follow up annually and prn.   After visit summary provided.

## 2017-01-12 ENCOUNTER — Encounter: Payer: Self-pay | Admitting: Obstetrics and Gynecology

## 2017-04-13 ENCOUNTER — Ambulatory Visit: Payer: Self-pay | Admitting: Family Medicine

## 2017-04-13 ENCOUNTER — Ambulatory Visit: Payer: 59 | Admitting: Family Medicine

## 2017-04-13 ENCOUNTER — Encounter: Payer: Self-pay | Admitting: Family Medicine

## 2017-04-13 VITALS — BP 124/68 | HR 85 | Temp 98.5°F | Ht 62.5 in | Wt 126.8 lb

## 2017-04-13 DIAGNOSIS — Z7689 Persons encountering health services in other specified circumstances: Secondary | ICD-10-CM | POA: Insufficient documentation

## 2017-04-13 DIAGNOSIS — Z01419 Encounter for gynecological examination (general) (routine) without abnormal findings: Secondary | ICD-10-CM

## 2017-04-13 DIAGNOSIS — T753XXS Motion sickness, sequela: Secondary | ICD-10-CM

## 2017-04-13 DIAGNOSIS — T753XXA Motion sickness, initial encounter: Secondary | ICD-10-CM | POA: Insufficient documentation

## 2017-04-13 LAB — CBC WITH DIFFERENTIAL/PLATELET
BASOS PCT: 0.6 % (ref 0.0–3.0)
Basophils Absolute: 0 10*3/uL (ref 0.0–0.1)
EOS ABS: 0 10*3/uL (ref 0.0–0.7)
Eosinophils Relative: 0.8 % (ref 0.0–5.0)
HEMATOCRIT: 44.4 % (ref 36.0–46.0)
HEMOGLOBIN: 15.3 g/dL — AB (ref 12.0–15.0)
LYMPHS PCT: 17.6 % (ref 12.0–46.0)
Lymphs Abs: 0.9 10*3/uL (ref 0.7–4.0)
MCHC: 34.3 g/dL (ref 30.0–36.0)
MCV: 101.9 fl — ABNORMAL HIGH (ref 78.0–100.0)
MONOS PCT: 5.4 % (ref 3.0–12.0)
Monocytes Absolute: 0.3 10*3/uL (ref 0.1–1.0)
NEUTROS ABS: 3.9 10*3/uL (ref 1.4–7.7)
Neutrophils Relative %: 75.6 % (ref 43.0–77.0)
PLATELETS: 228 10*3/uL (ref 150.0–400.0)
RBC: 4.36 Mil/uL (ref 3.87–5.11)
RDW: 12.9 % (ref 11.5–15.5)
WBC: 5.1 10*3/uL (ref 4.0–10.5)

## 2017-04-13 LAB — LIPID PANEL
CHOLESTEROL: 226 mg/dL — AB (ref 0–200)
HDL: 81.3 mg/dL (ref >39.00)
LDL CALC: 124 mg/dL — AB (ref 0–99)
NonHDL: 144.38
TRIGLYCERIDES: 103 mg/dL (ref 0.0–149.0)
Total CHOL/HDL Ratio: 3
VLDL: 20.6 mg/dL (ref 0.0–40.0)

## 2017-04-13 LAB — COMPREHENSIVE METABOLIC PANEL
ALBUMIN: 4.7 g/dL (ref 3.5–5.2)
ALT: 12 U/L (ref 0–35)
AST: 14 U/L (ref 0–37)
Alkaline Phosphatase: 62 U/L (ref 39–117)
BILIRUBIN TOTAL: 0.8 mg/dL (ref 0.2–1.2)
BUN: 9 mg/dL (ref 6–23)
CHLORIDE: 98 meq/L (ref 96–112)
CO2: 29 meq/L (ref 19–32)
CREATININE: 0.69 mg/dL (ref 0.40–1.20)
Calcium: 9.8 mg/dL (ref 8.4–10.5)
GFR: 90.82 mL/min (ref >60.00)
Glucose, Bld: 92 mg/dL (ref 70–99)
Potassium: 5.1 mEq/L (ref 3.5–5.1)
Sodium: 137 mEq/L (ref 135–145)
Total Protein: 7.2 g/dL (ref 6.0–8.3)

## 2017-04-13 LAB — TSH: TSH: 1.2 u[IU]/mL (ref 0.35–4.50)

## 2017-04-13 MED ORDER — SCOPOLAMINE 1 MG/3DAYS TD PT72: 1.0000 | MEDICATED_PATCH | TRANSDERMAL | 12 refills | Status: DC

## 2017-04-13 NOTE — Patient Instructions (Signed)
It was so great to meet you.   I will call you with your lab results from today and you can view them online.

## 2017-04-13 NOTE — Assessment & Plan Note (Signed)
eRx sent for scop patches.

## 2017-04-13 NOTE — Progress Notes (Signed)
Subjective:   Patient ID: Morgan Salinas, female    DOB: [birth year], 67 y.o.   MRN: [id number]  Morgan Salinas is a pleasant 67 y.o. year old female who presents to clinic today with New Patient (Initial Visit) (Patient is here today to establish care.  She states that she is going on vacation on a boat on 1.23.19 for 10 days and would like to possibly Scopolamine patches.  She declines flu shot as she has never had one and would like to wait till she gets back.  She has a GYN who did PAP in 2017 and was WNL.  Mammogram in Fall of 2018.)  on 04/13/2017  HPI:  Here to establish care. Has a GYN- Dr. Quincy Simmonds.  Pap smear and mammogram UTD. Colonoscopy UTD as well.  Has not had routine lab work done in years.  Very physically active- walks, plays tennis.  She is going on a sailing trip and is requesting scop patches for sea sickness.   No current outpatient medications on file prior to visit.   No current facility-administered medications on file prior to visit.     Allergies  Allergen Reactions  . Doxycycline Rash    Not sure it was the doxycycline that caused the rash    Past Medical History:  Diagnosis Date  . Fibroid 1994    Resolved via TAH  . Heel fracture 2013   left foot  . History of chickenpox     Past Surgical History:  Procedure Laterality Date  . ABDOMINAL HYSTERECTOMY  1994   TAH, fibroids, endometiosis  . BREAST SURGERY  1977   right breast bx, benign  . COLONOSCOPY  12/2010   3 polyps recheck 5 years    Family History  Problem Relation Age of Onset  . Colon cancer Father        age 56  . Hypertension Mother   . Heart disease Mother   . Asthma Brother     Social History   Socioeconomic History  . Marital status: Married    Spouse name: Not on file  . Number of children: Not on file  . Years of education: Not on file  . Highest education level: Not on file  Social Needs  . Financial resource strain: Not on file  . Food insecurity - worry:  Not on file  . Food insecurity - inability: Not on file  . Transportation needs - medical: Not on file  . Transportation needs - non-medical: Not on file  Occupational History  . Not on file  Tobacco Use  . Smoking status: Never Smoker  . Smokeless tobacco: Never Used  Substance and Sexual Activity  . Alcohol use: Yes    Alcohol/week: 4.2 - 4.8 oz    Types: 3 - 4 Glasses of wine, 4 Standard drinks or equivalent per week    Comment: 3-4 glasses of wine a week  . Drug use: No  . Sexual activity: No    Partners: Male    Birth control/protection: Surgical    Comment: TAH  Other Topics Concern  . Not on file  Social History Narrative  . Not on file   The PMH, PSH, Social History, Family History, Medications, and allergies have been reviewed in [facility], and have been updated if relevant.   Review of Systems  Constitutional: Negative.   HENT: Negative.   Eyes: Negative.   Respiratory: Negative.   Cardiovascular: Negative.   Gastrointestinal: Negative.   Endocrine: Negative.  Genitourinary: Negative.   Musculoskeletal: Negative.   Skin: Negative.   Allergic/Immunologic: Negative.   Neurological: Negative.   Hematological: Negative.   Psychiatric/Behavioral: Negative.   All other systems reviewed and are negative.      Objective:    BP 124/68 (BP Location: Left Arm, Patient Position: Sitting, Cuff Size: Normal)   Pulse 85   Temp 98.5 F (36.9 C) (Oral)   Ht 5' 2.5" (1.588 m)   Wt 126 lb 12.8 oz (57.5 kg)   LMP 03/30/1992   SpO2 98%   BMI 22.82 kg/m    Physical Exam  Constitutional: She is oriented to person, place, and time. She appears well-developed and well-nourished. No distress.  HENT:  Head: Atraumatic.  Eyes: Conjunctivae are normal.  Neck: Normal range of motion. Neck supple.  Cardiovascular: Normal rate and regular rhythm.  Pulmonary/Chest: Effort normal and breath sounds normal.  Abdominal: Soft. Bowel sounds are normal.  Musculoskeletal: Normal  range of motion. She exhibits no edema.  Neurological: She is alert and oriented to person, place, and time. No cranial nerve deficit.  Skin: Skin is warm and dry. She is not diaphoretic.  Psychiatric: She has a normal mood and affect. Her behavior is normal. Judgment and thought content normal.  Nursing note and vitals reviewed.         Assessment & Plan:   Well woman exam - Plan: CBC with Differential/Platelet, Comprehensive metabolic panel, Lipid panel, TSH  Sea sickness, sequela No Follow-up on file.

## 2017-04-13 NOTE — Assessment & Plan Note (Signed)
Discussed medical history, chart updated. Routine preventative labs drawn today.

## 2018-01-20 NOTE — Progress Notes (Deleted)
67 y.o. G2P2 Married Caucasian female here for annual exam.    PCP:     Patient's last menstrual period was 03/30/1992.           Sexually active: {yes no:314532}  The current method of family planning is status post hysterectomy.    Exercising: {yes no:314532}  {types:19826} Smoker:  no  Health Maintenance: Pap:  2002 Neg History of abnormal Pap:  no MMG: ***12-31-16 3D, Neg/Density D/biRads2 Colonoscopy: 04-02-16 polyps with Dr.Mann;next due 3-5 years. BMD: 09-25-14  Result : Normal TDaP:  11-14-15 Gardasil:   no HIV: with PCP Hep C: with PCP Screening Labs:  Hb today: ***, Urine today: ***   reports that she has never smoked. She has never used smokeless tobacco. She reports that she drinks about 7.0 - 8.0 standard drinks of alcohol per week. She reports that she does not use drugs.  Past Medical History:  Diagnosis Date  . Fibroid 1994    Resolved via TAH  . Heel fracture 2013   left foot  . History of chickenpox     Past Surgical History:  Procedure Laterality Date  . ABDOMINAL HYSTERECTOMY  1994   TAH, fibroids, endometiosis  . BREAST SURGERY  1977   right breast bx, benign  . COLONOSCOPY  12/2010   3 polyps recheck 5 years    Current Outpatient Medications  Medication Sig Dispense Refill  . scopolamine (TRANSDERM-SCOP, 1.5 MG,) 1 MG/3DAYS Place 1 patch (1.5 mg total) onto the skin every 3 (three) days. 10 patch 12   No current facility-administered medications for this visit.     Family History  Problem Relation Age of Onset  . Colon cancer Father        age 9  . Hypertension Mother   . Heart disease Mother   . Asthma Brother     Review of Systems  Exam:   LMP 03/30/1992     General appearance: alert, cooperative and appears stated age Head: Normocephalic, without obvious abnormality, atraumatic Neck: no adenopathy, supple, symmetrical, trachea midline and thyroid normal to inspection and palpation Lungs: clear to auscultation  bilaterally Breasts: normal appearance, no masses or tenderness, No nipple retraction or dimpling, No nipple discharge or bleeding, No axillary or supraclavicular adenopathy Heart: regular rate and rhythm Abdomen: soft, non-tender; no masses, no organomegaly Extremities: extremities normal, atraumatic, no cyanosis or edema Skin: Skin color, texture, turgor normal. No rashes or lesions Lymph nodes: Cervical, supraclavicular, and axillary nodes normal. No abnormal inguinal nodes palpated Neurologic: Grossly normal  Pelvic: External genitalia:  no lesions              Urethra:  normal appearing urethra with no masses, tenderness or lesions              Bartholins and Skenes: normal                 Vagina: normal appearing vagina with normal color and discharge, no lesions              Cervix: no lesions              Pap taken: {yes no:314532} Bimanual Exam:  Uterus:  normal size, contour, position, consistency, mobility, non-tender              Adnexa: no mass, fullness, tenderness              Rectal exam: {yes no:314532}.  Confirms.  Anus:  normal sphincter tone, no lesions  Chaperone was present for exam.  Assessment:   Well woman visit with normal exam.   Plan: Mammogram screening. Recommended self breast awareness. Pap and HR HPV as above. Guidelines for Calcium, Vitamin D, regular exercise program including cardiovascular and weight bearing exercise.   Follow up annually and prn.   Additional counseling given.  {yes [id number]. _______ minutes face to face time of which over 50% was spent in counseling.    After visit summary provided.

## 2018-01-21 ENCOUNTER — Ambulatory Visit: Payer: Medicare Other | Admitting: Obstetrics and Gynecology

## 2018-02-02 ENCOUNTER — Ambulatory Visit (INDEPENDENT_AMBULATORY_CARE_PROVIDER_SITE_OTHER): Payer: Medicare Other | Admitting: Obstetrics and Gynecology

## 2018-02-02 ENCOUNTER — Encounter: Payer: Self-pay | Admitting: Obstetrics and Gynecology

## 2018-02-02 ENCOUNTER — Other Ambulatory Visit: Payer: Self-pay

## 2018-02-02 VITALS — BP 110/60 | HR 70 | Resp 14 | Ht 62.0 in | Wt 125.0 lb

## 2018-02-02 DIAGNOSIS — Z124 Encounter for screening for malignant neoplasm of cervix: Secondary | ICD-10-CM

## 2018-02-02 DIAGNOSIS — Z01419 Encounter for gynecological examination (general) (routine) without abnormal findings: Secondary | ICD-10-CM | POA: Diagnosis not present

## 2018-02-02 NOTE — Progress Notes (Signed)
67 y.o. G2P2 Married Caucasian female here for annual exam.    Still playing tennis and doing 60 mile walks.  Practicing law.   No vaginal dryness or irritation.  No bladder or bowel concerns.   Used to See Dr. Radford Pax for dermatology appointments.  PCP:  Arnette Norris, MD   Patient's last menstrual period was 03/30/1992.           Sexually active: No. female The current method of family planning is status post hysterectomy.    Exercising: Yes.    walking and weights Smoker:  no  Health Maintenance: Pap: 2002 Neg History of abnormal Pap:  no MMG: 12-31-16 3D/Neg/density D/BiRads2--Pt.knows to schedule Colonoscopy: 04-02-16 polyps with Dr.Mann;next due 3-5 years. BMD: 09-25-14  Result :Normal TDaP:  11-14-15 Gardasil:   no HIV: with PCP Hep C:with PCP Screening Labs:  ---   reports that she has never smoked. She has never used smokeless tobacco. She reports that she drinks about 7.0 - 8.0 standard drinks of alcohol per week. She reports that she does not use drugs.  Past Medical History:  Diagnosis Date  . Fibroid 1994    Resolved via TAH  . Heel fracture 2013   left foot  . History of chickenpox     Past Surgical History:  Procedure Laterality Date  . ABDOMINAL HYSTERECTOMY  1994   TAH, fibroids, endometiosis  . BREAST SURGERY  1977   right breast bx, benign  . COLONOSCOPY  12/2010   3 polyps recheck 5 years    No current outpatient medications on file.   No current facility-administered medications for this visit.     Family History  Problem Relation Age of Onset  . Colon cancer Father        age 4  . Hypertension Mother   . Heart disease Mother   . Asthma Brother     Review of Systems  All other systems reviewed and are negative.   Exam:   BP 110/60 (BP Location: Right Arm, Patient Position: Sitting, Cuff Size: Normal)   Pulse 70   Resp 14   Ht 5\' 2"  (1.575 m)   Wt 125 lb (56.7 kg)   LMP 03/30/1992   BMI 22.86 kg/m     General appearance: alert,  cooperative and appears stated age Head: Normocephalic, without obvious abnormality, atraumatic Neck: no adenopathy, supple, symmetrical, trachea midline and thyroid normal to inspection and palpation Lungs: clear to auscultation bilaterally Breasts: normal appearance, no masses or tenderness, No nipple retraction or dimpling, No nipple discharge or bleeding, No axillary or supraclavicular adenopathy Heart: regular rate and rhythm Abdomen: soft, non-tender; no masses, no organomegaly Extremities: extremities normal, atraumatic, no cyanosis or edema Skin: Skin color, texture, turgor normal. No rashes or lesions.  Cherry hemangioma of the chest wall and abdomen.  Lymph nodes: Cervical, supraclavicular, and axillary nodes normal. No abnormal inguinal nodes palpated Neurologic: Grossly normal  Pelvic: External genitalia:  no lesions              Urethra:  normal appearing urethra with no masses, tenderness or lesions              Bartholins and Skenes: normal                 Vagina: normal appearing vagina with normal color and discharge, no lesions              Cervix:  absent  Pap taken: No. Bimanual Exam:  Uterus:   absent              Adnexa: no mass, fullness, tenderness              Rectal exam: No..  Confirms.              Anus:  normal sphincter tone, no lesions  Chaperone was present for exam.  Assessment:   Well woman visit with normal exam. Status post TAH for endometriosis and fibroids. Ovaries remain.  Menopausal female. Hx vaginal atrophy.   Plan: Mammogram screening.  I discussed 3D.  Recommended self breast awareness. Pap and HR HPV as above. Guidelines for Calcium, Vitamin D, regular exercise program including cardiovascular and weight bearing exercise. She will re-establish care with [facility] and call me if she has difficulty making an appointment as long as they are on her provider list with her ins company.  Follow up annually and prn.     After visit summary provided.

## 2018-02-02 NOTE — Patient Instructions (Signed)

## 2018-07-18 ENCOUNTER — Telehealth: Payer: Self-pay | Admitting: Family Medicine

## 2018-07-18 NOTE — Telephone Encounter (Signed)
Called on behalf of Dr. Deborra Medina because has been a while since last appt. Left message

## 2019-01-11 ENCOUNTER — Encounter: Payer: Self-pay | Admitting: Obstetrics and Gynecology

## 2019-02-10 ENCOUNTER — Other Ambulatory Visit: Payer: Self-pay

## 2019-02-10 ENCOUNTER — Ambulatory Visit: Payer: Medicare Other | Admitting: Obstetrics and Gynecology

## 2019-02-13 NOTE — Progress Notes (Signed)
68 y.o. G2P2 Married Caucasian female here for annual exam.    Not very sexually active.   [place] cour[place] by 9:00 am.  She is an Forensic psychologist.   PCP:  Arnette Norris, MD   Patient's last menstrual period was 03/30/1992.           Sexually active: No.  The current method of family planning is status post hysterectomy.    Exercising: Yes.    some walking and some tennis Smoker:  no  Health Maintenance: Pap: 2002 Neg History of abnormal Pap:  no MMG: 01-11-19 3D/Neg/density D/BiRads1 Colonoscopy: 1-4-18polyps;next 3-5 years BMD: 09-25-14  Result :normal TDaP: 11-14-15 Gardasil:   no [id number] PCP Hep C:with PCP Screening Labs: PCP.  Flu vaccine:  She will do this.    reports that she has never smoked. She has never used smokeless tobacco. She reports current alcohol use of about 7.0 - 8.0 standard drinks of alcohol per week. She reports that she does not use drugs.  Past Medical History:  Diagnosis Date  . Fibroid 1994    Resolved via TAH  . Heel fracture 2013   left foot  . History of chickenpox     Past Surgical History:  Procedure Laterality Date  . ABDOMINAL HYSTERECTOMY  1994   TAH, fibroids, endometiosis  . BREAST SURGERY  1977   right breast bx, benign  . COLONOSCOPY  12/2010   3 polyps recheck 5 years    No current outpatient medications on file.   No current facility-administered medications for this visit.     Family History  Problem Relation Age of Onset  . Colon cancer Father        age 71  . Hypertension Mother   . Heart disease Mother   . Asthma Brother     Review of Systems  All other systems reviewed and are negative.   Exam:   BP 118/72   Pulse 76   Temp (!) 97.5 F (36.4 C) (Temporal)   Resp 16   Ht 5\' 2"  (1.575 m)   Wt 127 lb 3.2 oz (57.7 kg)   LMP 03/30/1992   BMI 23.27 kg/m     General appearance: alert, cooperative and appears stated age Head: normocephalic, without obvious abnormality, atraumatic Neck: no  adenopathy, supple, symmetrical, trachea midline and thyroid normal to inspection and palpation Lungs: clear to auscultation bilaterally Breasts: normal appearance, no masses or tenderness, No nipple retraction or dimpling, No nipple discharge or bleeding, No axillary adenopathy Heart: regular rate and rhythm Abdomen: soft, non-tender; no masses, no organomegaly Extremities: extremities normal, atraumatic, no cyanosis or edema Skin: skin color, texture, turgor normal. No rashes or lesions Lymph nodes: cervical, supraclavicular, and axillary nodes normal. Neurologic: grossly normal  Pelvic: External genitalia:  no lesions              No abnormal inguinal nodes palpated.              Urethra:  normal appearing urethra with no masses, tenderness or lesions              Bartholins and Skenes: normal                 Vagina: normal appearing vagina with normal color and discharge, no lesions              Cervix: absent              Pap taken: No. Bimanual Exam:  Uterus: absent              Adnexa: no mass, fullness, tenderness              Rectal exam: Yes.  .  Confirms.              Anus:  normal sphincter tone, no lesions  Chaperone was present for exam.  Assessment:   Well woman visit with normal exam. Status post TAH for endometriosis and fibroids. Ovaries remain.  Menopausal female. Hx vaginal atrophy.  FH colon cancer.  Plan: Mammogram screening discussed. Self breast awareness reviewed. Pap and HR HPV as above. Guidelines for Calcium, Vitamin D, regular exercise program including cardiovascular and weight bearing exercise. Labs with PCP.  She will due a flu vaccine at her local pharmacy. Follow up annually and prn.   After visit summary provided.

## 2019-02-14 ENCOUNTER — Ambulatory Visit (INDEPENDENT_AMBULATORY_CARE_PROVIDER_SITE_OTHER): Payer: Medicare Other | Admitting: Obstetrics and Gynecology

## 2019-02-14 ENCOUNTER — Encounter: Payer: Self-pay | Admitting: Obstetrics and Gynecology

## 2019-02-14 ENCOUNTER — Other Ambulatory Visit: Payer: Self-pay

## 2019-02-14 VITALS — BP 118/72 | HR 76 | Temp 97.5°F | Resp 16 | Ht 62.0 in | Wt 127.2 lb

## 2019-02-14 DIAGNOSIS — Z01419 Encounter for gynecological examination (general) (routine) without abnormal findings: Secondary | ICD-10-CM

## 2019-02-14 DIAGNOSIS — Z124 Encounter for screening for malignant neoplasm of cervix: Secondary | ICD-10-CM

## 2019-02-14 NOTE — Patient Instructions (Signed)

## 2019-04-13 ENCOUNTER — Ambulatory Visit: Payer: Medicare Other | Admitting: Obstetrics and Gynecology

## 2019-04-28 ENCOUNTER — Ambulatory Visit: Payer: Self-pay

## 2019-05-06 ENCOUNTER — Ambulatory Visit: Payer: Self-pay

## 2019-05-09 ENCOUNTER — Ambulatory Visit: Payer: Self-pay

## 2020-05-03 ENCOUNTER — Encounter: Payer: Self-pay | Admitting: Obstetrics and Gynecology

## 2020-09-17 ENCOUNTER — Other Ambulatory Visit: Payer: Self-pay

## 2020-09-18 ENCOUNTER — Encounter: Payer: Self-pay | Admitting: Family Medicine

## 2020-09-18 ENCOUNTER — Ambulatory Visit (INDEPENDENT_AMBULATORY_CARE_PROVIDER_SITE_OTHER): Payer: Medicare Other | Admitting: Family Medicine

## 2020-09-18 VITALS — BP 136/80 | HR 84 | Temp 97.3°F | Ht 63.0 in | Wt 129.0 lb

## 2020-09-18 DIAGNOSIS — Z860100 Personal history of colon polyps, unspecified: Secondary | ICD-10-CM | POA: Insufficient documentation

## 2020-09-18 DIAGNOSIS — R030 Elevated blood-pressure reading, without diagnosis of hypertension: Secondary | ICD-10-CM | POA: Diagnosis not present

## 2020-09-18 DIAGNOSIS — E785 Hyperlipidemia, unspecified: Secondary | ICD-10-CM

## 2020-09-18 LAB — LIPID PANEL
Cholesterol: 197 mg/dL (ref 0–200)
HDL: 77.7 mg/dL (ref >39.00)
LDL Cholesterol: 101 mg/dL — ABNORMAL HIGH (ref 0–99)
NonHDL: 118.8
Total CHOL/HDL Ratio: 3
Triglycerides: 87 mg/dL (ref 0.0–149.0)
VLDL: 17.4 mg/dL (ref 0.0–40.0)

## 2020-09-18 NOTE — Progress Notes (Signed)
[facility] [address] [place] [address] Dept: [phone] Dept Fax: [phone]  Transfer of Care Office Visit  Subjective:    Patient ID: Morgan Salinas, female    DOB: 1950-12-30, 69 y.o..   MRN: [id number]  Chief Complaint  Patient presents with   Establish Care    NP- establish care.  Fasting today.  No concerns.      History of Present Illness:  Patient is in today to establish care. Morgan Salinas is originally from [place], [place]. She attended college at [employer] and [employer]. 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 She denies tobacco or drug use. She drinks a beer on weekends and a glass of wine with dinner a few nights a week. She stays active with tennis and gardening.  Morgan Salinas has a history of colon polyps. She had her last colonoscopy in 06-25-15. Her father died from colon cancer. She knows that she is due for follow-up colonoscopy at this point.  Morgan Salinas has a history of a right foot bunion. This is not currently bothersome to her. She has had prior issues with plantar fasciitis. Much of this resolved when she reduced running and stopped wearing high heels.  Morgan Salinas has had prior borderline hyperlipidemia. Her mother was treated for hypertension.  Past Medical History: Patient Active Problem List   Diagnosis Date Noted   Personal history of colonic polyps 09/18/2020   Borderline hyperlipidemia 09/18/2020   Elevated blood pressure reading without diagnosis of hypertension 09/18/2020   Bunion of right foot 04/18/2008   Past Surgical History:  Procedure Laterality Date   ABDOMINAL HYSTERECTOMY  1994   TAH, fibroids, endometiosis   BREAST SURGERY  1977   right breast bx, benign   COLONOSCOPY  12/2010   3 polyps recheck 5 years   Family History  Problem Relation Age of  Onset   Colon cancer Father        age 52   Hypertension Mother    Heart disease Mother    Asthma Brother    Outpatient Medications Prior to Visit  Medication Sig Dispense Refill   Ascorbic Acid (VITAMIN C) 1000 MG tablet Take 1,000 mg by mouth daily.     Multiple Vitamin (MULTIVITAMIN ADULT PO)      No facility-administered medications prior to visit.   Allergies  Allergen Reactions   Doxycycline Rash    Not sure it was the doxycycline that caused the rash   Objective:   Today's Vitals   09/18/20 0824  BP: 136/80  Pulse: 84  Temp: (!) 97.3 F (36.3 C)  TempSrc: Temporal  SpO2: 99%  Weight: 129 lb (58.5 kg)  Height: 5\' 3"  (1.6 m)   Body mass index is 22.85 kg/m.   General: Well developed, well nourished. No acute distress. Psych: Alert and oriented x3. Normal mood and affect.  Health Maintenance Due  Topic Date Due   Hepatitis C Screening  Never done   Zoster Vaccines- Shingrix (1 of 2) Never done   PNA vac Low Risk Adult (1 of 2 - PCV13) Never done   COVID-19 Vaccine (4 - Booster for Moderna series) 07/07/2020     Lab results: Lab Results  Component Value Date   CHOL 226 (H) 04/13/2017   HDL 81.30 04/13/2017   LDLCALC 124 (H) 04/13/2017   TRIG  103.0 04/13/2017   CHOLHDL 3 04/13/2017   Assessment & Plan:   1. Elevated blood pressure reading without diagnosis of hypertension Morgan Salinas's blood pressure is elevated today in a Stage 1 hypertension range. We discussed moderated use of caffeine and alcohol, watching salt intake, regular exercise, and stress reduction. I would like to see her again in 6 months to keep an eye on her blood pressure.  2. Borderline hyperlipidemia Morgan Salinas has had prior borderline elevation opf her total and LDL cholesterol. Her risk for CV has been low. I recommend we repeat a fasting lipid profile for monitoring.  - Lipid panel  3. Personal history of colonic polyps Morgan Salinas has a family history of colon cancer and has had  prior benign polyps. She is due this year for a repeat colonoscopy.  - Ambulatory referral to Gastroenterology   Morgan Salter, MD

## 2020-11-25 ENCOUNTER — Telehealth: Payer: Self-pay | Admitting: Family Medicine

## 2020-11-25 NOTE — Telephone Encounter (Signed)
Left message for patient to call back and schedule Medicare Annual Wellness Visit (AWV). Please offer to do virtually or by telephone.  Left office number and my jabber #[phone]. ? ?Due for AWVI ? ?Please schedule at anytime with Nurse Health Advisor. ?  ?

## 2021-02-04 ENCOUNTER — Telehealth: Payer: Self-pay | Admitting: Family Medicine

## 2021-02-04 NOTE — Telephone Encounter (Signed)
Left message for patient to call back and schedule Medicare Annual Wellness Visit (AWV) in office.  ° °If not able to come in office, please offer to do virtually or by telephone.  Left office number and my jabber #[phone]. ° °Due for AWVI ° °Please schedule at anytime with Nurse Health Advisor. °  °

## 2021-03-20 ENCOUNTER — Ambulatory Visit: Payer: Medicare Other | Admitting: Family Medicine

## 2021-04-04 ENCOUNTER — Other Ambulatory Visit: Payer: Self-pay

## 2021-04-04 ENCOUNTER — Ambulatory Visit (INDEPENDENT_AMBULATORY_CARE_PROVIDER_SITE_OTHER): Payer: Medicare Other | Admitting: Family Medicine

## 2021-04-04 VITALS — BP 124/78 | HR 88 | Temp 97.5°F | Ht 63.0 in | Wt 126.8 lb

## 2021-04-04 DIAGNOSIS — Z23 Encounter for immunization: Secondary | ICD-10-CM

## 2021-04-04 DIAGNOSIS — R030 Elevated blood-pressure reading, without diagnosis of hypertension: Secondary | ICD-10-CM | POA: Diagnosis not present

## 2021-04-04 DIAGNOSIS — Z86010 Personal history of colonic polyps: Secondary | ICD-10-CM | POA: Diagnosis not present

## 2021-04-04 DIAGNOSIS — E785 Hyperlipidemia, unspecified: Secondary | ICD-10-CM

## 2021-04-04 NOTE — Progress Notes (Signed)
[facility] [address] [place] [address] Dept: [phone] Dept Fax: [phone]  Chronic Care Office Visit  Subjective:    Patient ID: Morgan Salinas, female    DOB: 08-08-1950, 71 y.o..   MRN: [id number]  Chief Complaint  Patient presents with   Follow-up    6 month f/u.  No concerns.  Wants flu shot today.     History of Present Illness:  Patient is in today for reassessment of chronic medical issues.  Morgan Salinas had an elevated blood pressure at her last visit (136/80- Stage 1 hypertension per AH/ACC guidelines). She is in today for monitoring of this. 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  Morgan Salinas has a history of colon polyps. She had her last colonoscopy in 2015/07/04. Her father died from colon cancer. She I scheduled for a follow-up colonoscopy on March 8th.   Morgan Salinas has had prior borderline hyperlipidemia. Her mother was treated for hypertension.  Past Medical History: Patient Active Problem List   Diagnosis Date Noted   Personal history of colonic polyps 09/18/2020   Borderline hyperlipidemia 09/18/2020   Elevated blood pressure reading without diagnosis of hypertension 09/18/2020   Bunion of right foot 04/18/2008   Past Surgical History:  Procedure Laterality Date   ABDOMINAL HYSTERECTOMY  1994   TAH, fibroids, endometiosis   BREAST SURGERY  1977   right breast bx, benign   COLONOSCOPY  12/2010   3 polyps recheck 5 years   Family History  Problem Relation Age of Onset   Colon cancer Father        age 39   Hypertension Mother    Heart disease Mother    Asthma Brother    Outpatient Medications Prior to Visit  Medication Sig Dispense Refill   Ascorbic Acid (VITAMIN C) 1000 MG tablet Take 1,000 mg by mouth daily.     Multiple Vitamin (MULTIVITAMIN ADULT PO)      No  facility-administered medications prior to visit.   Allergies  Allergen Reactions   Doxycycline Rash    Not sure it was the doxycycline that caused the rash    Objective:   Today's Vitals   04/04/21 1344  BP: 124/78  Pulse: 88  Temp: (!) 97.5 F (36.4 C)  TempSrc: Temporal  SpO2: 97%  Weight: 126 lb 12.8 oz (57.5 kg)  Height: 5\' 3"  (1.6 m)   Body mass index is 22.46 kg/m.   General: Well developed, well nourished. No acute distress. Psych: Alert and oriented. Normal mood and affect.  Health Maintenance Due  Topic Date Due   Hepatitis C Screening  Never done   Zoster Vaccines- Shingrix (1 of 2) Never done   Pneumonia Vaccine 23+ Years old (1 - PCV) Never done   COLONOSCOPY (Pts 45-97yrs Insurance coverage will need to be confirmed)  03/30/2020   COVID-19 Vaccine (4 - Booster for Moderna series) 05/03/2020   INFLUENZA VACCINE  Never done   Lab Results Last lipids Lab Results  Component Value Date   CHOL 197 09/18/2020   HDL 77.70 09/18/2020   LDLCALC 101 (H) 09/18/2020   TRIG 87.0 09/18/2020   CHOLHDL 3 09/18/2020   Assessment & Plan:   1. Elevated blood pressure reading without diagnosis of hypertension Blood pressure is normal today. Morgan Salinas does feel retiring will allow her to focus more  on her health.   2. Borderline hyperlipidemia Last lipids with only very minor elevation of LDL. We will reassess in 6 months. If stable, we can stretch this out to every 2-5 years.  3. Personal history of colonic polyps Colonoscopy is scheduled.  4. Need for influenza vaccination  - Flu Vaccine QUAD High Dose(Fluad)   Haydee Salter, MD

## 2021-04-28 ENCOUNTER — Telehealth: Payer: Self-pay | Admitting: Family Medicine

## 2021-04-28 NOTE — Telephone Encounter (Signed)
Left message for patient to call back and schedule Medicare Annual Wellness Visit (AWV) in office.  ° °If not able to come in office, please offer to do virtually or by telephone.  Left office number and my jabber #[phone]. ° °Due for AWVI ° °Please schedule at anytime with Nurse Health Advisor. °  °

## 2021-04-30 NOTE — Telephone Encounter (Signed)
Pt has colonoscopy and some follow up visits coming up. She will call to schedule AWV at a later date.

## 2021-05-14 LAB — HM MAMMOGRAPHY

## 2021-05-16 ENCOUNTER — Encounter: Payer: Self-pay | Admitting: Obstetrics and Gynecology

## 2021-05-16 ENCOUNTER — Encounter: Payer: Self-pay | Admitting: Family Medicine

## 2021-10-07 ENCOUNTER — Ambulatory Visit (INDEPENDENT_AMBULATORY_CARE_PROVIDER_SITE_OTHER): Payer: Medicare Other

## 2021-10-07 DIAGNOSIS — Z0000 Encounter for general adult medical examination without abnormal findings: Secondary | ICD-10-CM

## 2021-10-07 NOTE — Progress Notes (Signed)
Subjective:   Morgan Salinas is a 71 y.o. female who presents for an Initial Medicare Annual Wellness Visit.   I connected with Morgan Salinas  today by telephone and verified that I am speaking with the correct person using two identifiers. Location patient: home Location provider: work Persons participating in the virtual visit: patient, provider.   I discussed the limitations, risks, security and privacy concerns of performing an evaluation and management service by telephone and the availability of in person appointments. I also discussed with the patient that there may be a patient responsible charge related to this service. The patient expressed understanding and verbally consented to this telephonic visit.    Interactive audio and video telecommunications were attempted between this provider and patient, however failed, due to patient having technical difficulties OR patient did not have access to video capability.  We continued and completed visit with audio only.    Review of Systems     Cardiac Risk Factors include: advanced age (>68mn, >>57women)     Objective:    Today's Vitals   There is no height or weight on file to calculate BMI.     10/07/2021    8:23 AM  Advanced Directives  Does Patient Have a Medical Advance Directive? Yes  Type of [place]amed[place]of [place]  [place] of [place]? No - copy requested    Current Medications (verified) Outpatient Encounter Medications as of 10/07/2021  Medication Sig   Ascorbic Acid (VITAMIN C) 1000 MG tablet Take 1,000 mg by mouth daily.   Multiple Vitamin (MULTIVITAMIN ADULT PO)    No facility-administered encounter medications on file as of 10/07/2021.    Allergies (verified) Doxycycline   History: Past Medical History:  Diagnosis Date   Fibroid 1994    Resolved via TAH   Heel fracture 2013   left foot   History of chickenpox    Past Surgical  History:  Procedure Laterality Date   ABDOMINAL HYSTERECTOMY  1994   TAH, fibroids, endometiosis   BREAST SURGERY  1977   right breast bx, benign   COLONOSCOPY  12/2010   3 polyps recheck 5 years   Family History  Problem Relation Age of Onset   Colon cancer Father        age 69  Hypertension Mother    Heart disease Mother    Asthma Brother    Social History   Socioeconomic History   Marital status: Married    Spouse name: Not on file   Number of children: 2   Years of education: Not on file   Highest education level: Professional school degree (e.g., MD, DDS, DVM, JD)  Occupational History   Occupation: LChief Executive Officer Tobacco Use   Smoking status: Never   Smokeless tobacco: Never  Vaping Use   Vaping Use: Never used  Substance and Sexual Activity   Alcohol use: Yes    Alcohol/week: 7.0 - 8.0 standard drinks of alcohol    Types: 3 - 4 Glasses of wine, 4 Standard drinks or equivalent per week    Comment: 3-4 glasses of wine a week   Drug use: No   Sexual activity: Not Currently    Partners: Male    Birth control/protection: Surgical    Comment: TAH  Other Topics Concern   Not on file  Social History Narrative   Not on file   Social Determinants of Health   Financial Resource Strain: Low Risk  (  10/07/2021)   Overall Financial Resource Strain (CARDIA)    Difficulty of Paying Living Expenses: Not hard at all  Food Insecurity: No Food Insecurity (10/07/2021)   Hunger Vital Sign    Worried About Running Out of Food in the Last Year: Never true    Ran Out of Food in the Last Year: Never true  Transportation Needs: No Transportation Needs (10/07/2021)   PRAPARE - Hydrologist (Medical): No    Lack of Transportation (Non-Medical): No  Physical Activity: Sufficiently Active (10/07/2021)   Exercise Vital Sign    Days of Exercise per Week: 3 days    Minutes of Exercise per Session: 60 min  Stress: No Stress Concern Present (10/07/2021)   [place]    Feeling of Stress : Not at all  Social Connections: [place] (10/07/2021)   Social Connection and Isolation Panel [NHANES]    Frequency of Communication with Friends and Family: Three times a week    Frequency of Social Gatherings with Friends and Family: Three times a week    Attends Religious Services: More than 4 times per year    Active Member of Clubs or Organizations: Yes    Attends Music therapist: More than 4 times per year    Marital Status: Married    Tobacco Counseling Counseling given: Not Answered   Clinical Intake:  Pre-visit preparation completed: Yes  Pain : No/denies pain     Nutritional Risks: None  How often do you need to have someone help you when you read instructions, pamphlets, or other written materials from your doctor or pharmacy?: 1 - Never What is the last grade level you completed in school?: college  Diabetic?no   Interpreter Needed?: No  Information entered by :: [place] [facility] Daily Living    10/07/2021    8:29 AM  In your present state of health, do you have any difficulty performing the following activities:  Hearing? 0  Vision? 0  Difficulty concentrating or making decisions? 0  Walking or climbing stairs? 0  Dressing or bathing? 0  Doing errands, shopping? 0  Preparing Food and eating ? N  Using the Toilet? N  In the past six months, have you accidently leaked urine? N  Do you have problems with loss of bowel control? N  Managing your Medications? N  Managing your Finances? N  Housekeeping or managing your Housekeeping? N    Patient Care Team: Haydee Salter, MD as PCP - General (Family Medicine) Yisroel Ramming, Everardo All, MD as Consulting Physician (Obstetrics and Gynecology) Juanita Craver, MD as Consulting Physician (Gastroenterology)  Indicate any recent Medical Services you may have received from other than  [facility] providers in the past year (date may be approximate).     Assessment:   This is a routine wellness examination for Morgan Salinas.  Hearing/Vision screen Vision Screening - Comments:: Annual eye exam swear glasses /contacts   Dietary issues and exercise activities discussed: Current Exercise Habits: Home exercise routine, Type of exercise: walking, Time (Minutes): 60, Frequency (Times/Week): 5, Weekly Exercise (Minutes/Week): 300, Intensity: Mild, Exercise limited by: None identified   Goals Addressed   None    Depression Screen    10/07/2021    8:25 AM 10/07/2021    8:22 AM 09/18/2020    8:22 AM 04/13/2017   12:14 PM  PHQ 2/9 Scores  PHQ - 2 Score 0 0 0 0  Fall Risk    10/07/2021    8:24 AM 09/18/2020    8:22 AM 04/13/2017   12:14 PM  Fall Risk   Falls in the past year? 0 0 No  Number falls in past yr: 0 0   Injury with Fall? 0 0   Risk for fall due to : No Fall Risks    Follow up Falls evaluation completed;Education provided      FALL RISK PREVENTION PERTAINING TO THE HOME:  Any stairs in or around the home? Yes  If so, are there any without handrails? No  Home free of loose throw rugs in walkways, pet beds, electrical cords, etc? Yes  Adequate lighting in your home to reduce risk of falls? Yes   ASSISTIVE DEVICES UTILIZED TO PREVENT FALLS:  Life alert? No  Use of a cane, walker or w/c? No  Grab bars in the bathroom? Yes  Shower chair or bench in shower? Yes  Elevated toilet seat or a handicapped toilet? Yes   Cognitive Function:    Normal cognitive status assessed by telephone conversation  by this Nurse Health Advisor. No abnormalities found.      Immunizations Immunization History  Administered Date(s) Administered   DTaP 04/09/2009   Fluad Quad(high Dose 65+) 04/04/2021   Moderna Sars-Covid-2 Vaccination 04/28/2019, 06/02/2019, 03/08/2020   Tdap 11/14/2015    TDAP status: Up to date  Flu Vaccine status: Up to date  Pneumococcal vaccine status:  Due, Education has been provided regarding the importance of this vaccine. Advised may receive this vaccine at local pharmacy or Health Dept. Aware to provide a copy of the vaccination record if obtained from local pharmacy or Health Dept. Verbalized acceptance and understanding.  Covid-19 vaccine status: Completed vaccines  Qualifies for Shingles Vaccine? Yes   Zostavax completed Yes   Shingrix Completed?: No.    Education has been provided regarding the importance of this vaccine. Patient has been advised to call insurance company to determine out of pocket expense if they have not yet received this vaccine. Advised may also receive vaccine at local pharmacy or Health Dept. Verbalized acceptance and understanding.  Screening Tests Health Maintenance  Topic Date Due   Hepatitis C Screening  Never done   Zoster Vaccines- Shingrix (1 of 2) Never done   Pneumonia Vaccine 3+ Years old (1 - PCV) Never done   COVID-19 Vaccine (4 - Moderna series) 05/03/2020   INFLUENZA VACCINE  10/28/2021   MAMMOGRAM  05/16/2022   TETANUS/TDAP  11/13/2025   COLONOSCOPY (Pts 45-48yr Insurance coverage will need to be confirmed)  05/09/2026   DEXA SCAN  Completed   HPV VACCINES  Aged Out    Health Maintenance  Health Maintenance Due  Topic Date Due   Hepatitis C Screening  Never done   Zoster Vaccines- Shingrix (1 of 2) Never done   Pneumonia Vaccine 69 Years old (1 - PCV) Never done   COVID-19 Vaccine (4 - Moderna series) 05/03/2020    Colorectal cancer screening: Type of screening: Colonoscopy. Completed 05/09/2021. Repeat every 5 years  Mammogram status: Completed 05/16/2021. Repeat every year  Bone Density status: Completed 09/25/2014. Results reflect: Bone density results: NORMAL. Repeat every 5 years.  Lung Cancer Screening: (Low Dose CT Chest recommended if Age 69-80years, 30 pack-year currently smoking OR have quit w/in 15years.) does not qualify.   Lung Cancer Screening Referral:  n/a  Additional Screening:  Hepatitis C Screening: does qualify;   Vision Screening: Recommended annual ophthalmology exams for early  detection of glaucoma and other disorders of the eye. Is the patient up to date with their annual eye exam?  Yes  Who is the provider or what is the name of the office in which the patient attends annual eye exams? Dr.McQuen  If pt is not established with a provider, would they like to be referred to a provider to establish care? No .   Dental Screening: Recommended annual dental exams for proper oral hygiene  Community Resource Referral / Chronic Care Management: CRR required this visit?  No   CCM required this visit?  No      Plan:     I have personally reviewed and noted the following in the patient's chart:   Medical and social history Use of alcohol, tobacco or illicit drugs  Current medications and supplements including opioid prescriptions. Patient is not currently taking opioid prescriptions. Functional ability and status Nutritional status Physical activity Advanced directives List of other physicians Hospitalizations, surgeries, and ER visits in previous 12 months Vitals Screenings to include cognitive, depression, and falls Referrals and appointments  In addition, I have reviewed and discussed with patient certain preventive protocols, quality metrics, and best practice recommendations. A written personalized care plan for preventive services as well as general preventive health recommendations were provided to patient.     Randel Pigg, LPN   11/25/9369   Nurse Notes: none

## 2021-10-07 NOTE — Patient Instructions (Signed)
[name] , Thank you for taking time to come for your Medicare Wellness Visit. I appreciate your ongoing commitment to your health goals. Please review the following plan we discussed and let me know if I can assist you in the future.   Screening recommendations/referrals: Colonoscopy: 05/09/2021  due 5 years  Mammogram: 05/16/2021 Bone Density: 09/25/2014 Recommended yearly ophthalmology/optometry visit for glaucoma screening and checkup Recommended yearly dental visit for hygiene and checkup  Vaccinations: Influenza vaccine: completed  Pneumococcal vaccine: due  Tdap vaccine: 11/14/2015 Shingles vaccine: will consider     Advanced directives: yes   Conditions/risks identified: none   Next appointment: none    Preventive Care 1 Years and Older, Female Preventive care refers to lifestyle choices and visits with your health care provider that can promote health and wellness. What does preventive care include? A yearly physical exam. This is also called an annual well check. Dental exams once or twice a year. Routine eye exams. Ask your health care provider how often you should have your eyes checked. Personal lifestyle choices, including: Daily care of your teeth and gums. Regular physical activity. Eating a healthy diet. Avoiding tobacco and drug use. Limiting alcohol use. Practicing safe sex. Taking low-dose aspirin every day. Taking vitamin and mineral supplements as recommended by your health care provider. What happens during an annual well check? The services and screenings done by your health care provider during your annual well check will depend on your age, overall health, lifestyle risk factors, and family history of disease. Counseling  Your health care provider may ask you questions about your: Alcohol use. Tobacco use. Drug use. Emotional well-being. Home and relationship well-being. Sexual activity. Eating habits. History of falls. Memory and ability  to understand (cognition). Work and work Statistician. Reproductive health. Screening  You may have the following tests or measurements: Height, weight, and BMI. Blood pressure. Lipid and cholesterol levels. These may be checked every 5 years, or more frequently if you are over 42 years old. Skin check. Lung cancer screening. You may have this screening every year starting at age 50 if you have a 30-pack-year history of smoking and currently smoke or have quit within the past 15 years. Fecal occult blood test (FOBT) of the stool. You may have this test every year starting at age 93. Flexible sigmoidoscopy or colonoscopy. You may have a sigmoidoscopy every 5 years or a colonoscopy every 10 years starting at age 31. Hepatitis C blood test. Hepatitis B blood test. Sexually transmitted disease (STD) testing. Diabetes screening. This is done by checking your blood sugar (glucose) after you have not eaten for a while (fasting). You may have this done every 1-3 years. Bone density scan. This is done to screen for osteoporosis. You may have this done starting at age 90. Mammogram. This may be done every 1-2 years. Talk to your health care provider about how often you should have regular mammograms. Talk with your health care provider about your test results, treatment options, and if necessary, the need for more tests. Vaccines  Your health care provider may recommend certain vaccines, such as: Influenza vaccine. This is recommended every year. Tetanus, diphtheria, and acellular pertussis (Tdap, Td) vaccine. You may need a Td booster every 10 years. Zoster vaccine. You may need this after age 20. Pneumococcal 13-valent conjugate (PCV13) vaccine. One dose is recommended after age 38. Pneumococcal polysaccharide (PPSV23) vaccine. One dose is recommended after age 34. Talk to your health care provider about which screenings and vaccines  you need and how often you need them. This information is not  intended to replace advice given to you by your health care provider. Make sure you discuss any questions you have with your health care provider. Document Released: 04/12/2015 Document Revised: 12/04/2015 Document Reviewed: 01/15/2015 Elsevier Interactive Patient Education  2017 [place] Prevention in the Home Falls can cause injuries. They can happen to people of all ages. There are many things you can do to make your home safe and to help prevent falls. What can I do on the outside of my home? Regularly fix the edges of walkways and driveways and fix any cracks. Remove anything that might make you trip as you walk through a door, such as a raised step or threshold. Trim any bushes or trees on the path to your home. Use bright outdoor lighting. Clear any walking paths of anything that might make someone trip, such as rocks or tools. Regularly check to see if handrails are loose or broken. Make sure that both sides of any steps have handrails. Any raised decks and porches should have guardrails on the edges. Have any leaves, snow, or ice cleared regularly. Use sand or salt on walking paths during winter. Clean up any spills in your garage right away. This includes oil or grease spills. What can I do in the bathroom? Use night lights. Install grab bars by the toilet and in the tub and shower. Do not use towel bars as grab bars. Use non-skid mats or decals in the tub or shower. If you need to sit down in the shower, use a plastic, non-slip stool. Keep the floor dry. Clean up any water that spills on the floor as soon as it happens. Remove soap buildup in the tub or shower regularly. Attach bath mats securely with double-sided non-slip rug tape. Do not have throw rugs and other things on the floor that can make you trip. What can I do in the bedroom? Use night lights. Make sure that you have a light by your bed that is easy to reach. Do not use any sheets or blankets that are  too big for your bed. They should not hang down onto the floor. Have a firm chair that has side arms. You can use this for support while you get dressed. Do not have throw rugs and other things on the floor that can make you trip. What can I do in the kitchen? Clean up any spills right away. Avoid walking on wet floors. Keep items that you use a lot in easy-to-reach places. If you need to reach something above you, use a strong step stool that has a grab bar. Keep electrical cords out of the way. Do not use floor polish or wax that makes floors slippery. If you must use wax, use non-skid floor wax. Do not have throw rugs and other things on the floor that can make you trip. What can I do with my stairs? Do not leave any items on the stairs. Make sure that there are handrails on both sides of the stairs and use them. Fix handrails that are broken or loose. Make sure that handrails are as long as the stairways. Check any carpeting to make sure that it is firmly attached to the stairs. Fix any carpet that is loose or worn. Avoid having throw rugs at the top or bottom of the stairs. If you do have throw rugs, attach them to the floor with carpet tape. Make sure  that you have a light switch at the top of the stairs and the bottom of the stairs. If you do not have them, ask someone to add them for you. What else can I do to help prevent falls? Wear shoes that: Do not have high heels. Have rubber bottoms. Are comfortable and fit you well. Are closed at the toe. Do not wear sandals. If you use a stepladder: Make sure that it is fully opened. Do not climb a closed stepladder. Make sure that both sides of the stepladder are locked into place. Ask someone to hold it for you, if possible. Clearly mark and make sure that you can see: Any grab bars or handrails. First and last steps. Where the edge of each step is. Use tools that help you move around (mobility aids) if they are needed. These  include: Canes. Walkers. Scooters. Crutches. Turn on the lights when you go into a dark area. Replace any light bulbs as soon as they burn out. Set up your furniture so you have a clear path. Avoid moving your furniture around. If any of your floors are uneven, fix them. If there are any pets around you, be aware of where they are. Review your medicines with your doctor. Some medicines can make you feel dizzy. This can increase your chance of falling. Ask your doctor what other things that you can do to help prevent falls. This information is not intended to replace advice given to you by your health care provider. Make sure you discuss any questions you have with your health care provider. Document Released: 01/10/2009 Document Revised: 08/22/2015 Document Reviewed: 04/20/2014 Elsevier Interactive Patient Education  2017 [employer].

## 2021-11-03 ENCOUNTER — Encounter: Payer: Self-pay | Admitting: Family Medicine

## 2021-11-03 ENCOUNTER — Ambulatory Visit (INDEPENDENT_AMBULATORY_CARE_PROVIDER_SITE_OTHER): Payer: Medicare Other | Admitting: Family Medicine

## 2021-11-03 VITALS — BP 130/70 | HR 108 | Temp 97.6°F | Ht 63.0 in | Wt 128.2 lb

## 2021-11-03 DIAGNOSIS — S0003XA Contusion of scalp, initial encounter: Secondary | ICD-10-CM | POA: Diagnosis not present

## 2021-11-03 DIAGNOSIS — S060X0A Concussion without loss of consciousness, initial encounter: Secondary | ICD-10-CM

## 2021-11-03 NOTE — Patient Instructions (Signed)
Concussion, Adult ? ?A concussion is a brain injury from a hard, direct hit (trauma) to the head or body. This direct hit causes the brain to shake quickly back and forth inside the skull. This can damage brain cells and cause chemical changes in the brain. A concussion may also be known as a mild traumatic brain injury (TBI). ?Concussions are usually not life-threatening, but the effects of a concussion can be serious. If you have a concussion, you should be very careful to avoid having a second concussion. ?What are the causes? ?This condition is caused by: ?A direct hit to your head, such as: ?Running into another player during a game. ?Being hit in a fight. ?Hitting your head on a hard surface. ?Sudden movement of your body that causes your brain to move back and forth inside the skull, such as in a car crash. ?What are the signs or symptoms? ?The signs of a concussion can be hard to notice. Early on, they may be missed by you, family members, and health care providers. You may look fine on the outside but may act or feel differently. ?Every head injury is different. Symptoms are usually temporary but may last for days, weeks, or even months. Some symptoms appear right away, but other symptoms may not show up for hours or days. If your symptoms last longer than normal, you may have post-concussion syndrome. ?Physical symptoms ?Headaches. ?Dizziness and problems with coordination or balance. ?Sensitivity to light or noise. ?Nausea or vomiting. ?Tiredness (fatigue). ?Vision or hearing problems. ?Changes in eating or sleeping patterns. ?Seizure. ?Mental and emotional symptoms ?Irritability or mood changes. ?Memory problems. ?Trouble concentrating, organizing, or making decisions. ?Slowness in thinking, acting or reacting, speaking, or reading. ?Anxiety or depression. ?How is this diagnosed? ?This condition is diagnosed based on: ?Your symptoms. ?A description of your injury. ?You may also have tests,  including: ?Imaging tests, such as a CT scan or an MRI. ?Neuropsychological tests. These measure your thinking, understanding, learning, and remembering abilities. ?How is this treated? ?Treatment for this condition includes: ?Stopping sports or activity if you are injured. If you hit your head or show signs of concussion: ?Do not return to sports or activities the same day. ?Get checked by a health care provider before you return to your activities. ?Physical and mental rest and careful observation, usually at home. Gradually return to your normal activities. ?Medicines to help with symptoms such as headaches, nausea, or difficulty sleeping. ?Avoid taking opioid pain medicine while recovering from a concussion. ?Avoiding alcohol and drugs. These may slow your recovery and can put you at risk of further injury. ?Referral to a concussion clinic or rehabilitation center. ?Recovery from a concussion can take time. How fast you recover depends on many factors. Return to activities only when: ?Your symptoms are completely gone. ?Your health care provider says that it is safe. ?Follow these instructions at home: ?Activity ?Limit activities that require a lot of thought or concentration, such as: ?Doing homework or job-related work. ?Watching TV. ?Working on the computer or phone. ?Playing memory games and puzzles. ?Rest. Rest helps your brain heal. Make sure you: ?Get plenty of sleep. Most adults should get 7-9 hours of sleep each night. ?Rest during the day. Take naps or rest breaks when you feel tired. ?Avoid physical activity like exercise until your health care provider says it is safe. Stop any activity that worsens symptoms. ?Do not do high-risk activities that could cause a second concussion, such as riding a bike or playing   sports. ?Ask your health care provider when you can return to your normal activities, such as school, work, athletics, and driving. Your ability to react may be slower after a brain injury.  Never do these activities if you are dizzy. Your health care provider will likely give you a plan for gradually returning to activities. ?General instructions ? ?Take over-the-counter and prescription medicines only as told by your health care provider. Some medicines, such as blood thinners (anticoagulants) and aspirin, may increase the risk for complications, such as bleeding. ?Do not drink alcohol until your health care provider says you can. ?Watch your symptoms and tell others around you to do the same. Complications sometimes occur after a concussion. Older adults with a brain injury may have a higher risk of serious complications. ?Tell your work manager, teachers, school nurse, school counselor, coach, or athletic trainer about your injury, symptoms, and restrictions. ?Keep all follow-up visits as told by your health care provider. This is important. ?How is this prevented? ?Avoiding another brain injury is very important. In rare cases, another injury can lead to permanent brain damage, brain swelling, or death. The risk of this is greatest during the first 7-10 days after a head injury. Avoid injuries by: ?Stopping activities that could lead to a second concussion, such as contact or recreational sports, until your health care provider says it is okay. ?Taking these actions once you have returned to sports or activities: ?Avoiding plays or moves that can cause you to crash into another person. This is how most concussions occur. ?Following the rules and being respectful of other players. Do not engage in violent or illegal plays. ?Getting regular exercise that includes strength and balance training. ?Wearing a properly fitting helmet during sports, biking, or other activities. Helmets can help protect you from serious skull and brain injuries, but they may not protect you from a concussion. Even when wearing a helmet, you should avoid being hit in the head. ?Contact a health care provider if: ?Your  symptoms do not improve. ?You have new symptoms. ?You have another injury. ?Get help right away if: ?You have new or worsening physical symptoms, such as: ?A severe or worsening headache. ?Weakness or numbness in any part of your body, slurred speech, vision changes, or confusion. ?Your coordination gets worse. ?Vomiting repeatedly. ?You have a seizure. ?You have unusual behavior changes. ?You lose consciousness, are sleepier than normal, or are difficult to wake up. ?These symptoms may represent a serious problem that is an emergency. Do not wait to see if the symptoms will go away. Get medical help right away. Call your local emergency services (911 in the [place] Do not drive yourself to the hospital. ?Summary ?A concussion is a brain injury that results from a hard, direct hit (trauma) to your head or body. ?You may have imaging tests and neuropsychological tests to diagnose a concussion. ?Treatment for this condition includes physical and mental rest and careful observation. ?Ask your health care provider when you can return to your normal activities, such as school, work, athletics, and driving. ?Get help right away if you have a severe headache, weakness in any part of the body, seizures, behavior changes, changes in vision, or if you are confused or sleepier than normal. ?This information is not intended to replace advice given to you by your health care provider. Make sure you discuss any questions you have with your health care provider. ?Document Revised: 05/30/2020 Document Reviewed: 05/30/2020 ?Elsevier Patient Education ? 2023 [employer] ? ?

## 2021-11-03 NOTE — Progress Notes (Signed)
[facility] [address] [place] [address] Dept: [phone] Dept Fax: [phone]  Office Visit  Subjective:    Patient ID: Hayden Rasmussen, female    DOB: [birth year], 71 y.o..   MRN: [id number]  Chief Complaint  Patient presents with   Acute Visit    C/o having a fall last night hitting her head on brick steps.  HA, not feeling herself.      History of Present Illness:  Patient is in today for evaluation of a recent head injury. Ms. Orser notes that last night, she was in her garage getting something out of her car. She did not realize there was object just behind her. As she went to step back, she tripped over the object, falling back and striking her head on the brick step. She denies any bleeding form this. She does not feel she had a loss of consciousness. Her husband is away form home, so she was nervous about sleeping last night. She does feel that she has been mildly jittery today. She has some tenderness at the site of the trauma but no worsening headache. She has felt sleepy today, but notes she did not rest well last night. She has had some mild nausea, but is eating some. Her husband does not return until tomorrow.  Past Medical History: Patient Active Problem List   Diagnosis Date Noted   Personal history of colonic polyps 09/18/2020   Borderline hyperlipidemia 09/18/2020   Bunion of right foot 04/18/2008   Past Surgical History:  Procedure Laterality Date   ABDOMINAL HYSTERECTOMY  1994   TAH, fibroids, endometiosis   BREAST SURGERY  1977   right breast bx, benign   COLONOSCOPY  12/2010   3 polyps recheck 5 years   Family History  Problem Relation Age of Onset   Colon cancer Father        age 31   Hypertension Mother    Heart disease Mother    Asthma Brother    Outpatient Medications Prior to Visit  Medication Sig Dispense Refill   Ascorbic Acid (VITAMIN C) 1000 MG tablet Take 1,000 mg by mouth  daily.     Multiple Vitamin (MULTIVITAMIN ADULT PO)      No facility-administered medications prior to visit.   Allergies  Allergen Reactions   Doxycycline Rash    Not sure it was the doxycycline that caused the rash    Objective:   Today's Vitals   11/03/21 1431  BP: 130/70  Pulse: (!) 108  Temp: 97.6 F (36.4 C)  TempSrc: Temporal  SpO2: 98%  Weight: 128 lb 3.2 oz (58.2 kg)  Height: '5\' 3"'$  (1.6 m)   Body mass index is 22.71 kg/m.   General: Well developed, well nourished. No acute distress. HEENT: Normocephalic, non-traumatic. Scalp normal, though tender over the occiput. PERRL, EOMI. Conjunctiva   clear. Fundiscopic exam shows normal disc and vasculature. External ears normal. EAC and TMs normal bilaterally.   Nose clear without congestion or rhinorrhea. Mucous membranes moist. Oropharynx clear. Good dentition. Neuro: CN II-XII intact. Equal grip strength. Psych: Alert and oriented. Normal mood and affect.  Health Maintenance Due  Topic Date Due   Hepatitis C Screening  Never done   Zoster Vaccines- Shingrix (1 of 2) Never done   Pneumonia Vaccine 73+ Years old (1 - PCV) Never done   INFLUENZA VACCINE  10/28/2021     Assessment & Plan:   1. Contusion of scalp, initial encounter  I recommend she use ice packs on her scalp as needed. If she needs something for pain, I recommend Tylenol.   2. Concussion without loss of consciousness, initial encounter I recommend Ms. 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 Some of her symptoms could be due to her lack of sleep last night. I reassured her that she should be okay to get some sleep. I see no indication at this point to order a CT scan. if she were to develop any acute worsening of symptoms, she should go to the ED. If her concussion symptoms are persisting after a few days, She should reach out to me regarding referral to [facility] concussion clinic.   Return if symptoms worsen or fail to  improve.   Haydee Salter, MD

## 2021-11-06 ENCOUNTER — Telehealth: Payer: Self-pay | Admitting: Family Medicine

## 2021-11-06 NOTE — Telephone Encounter (Signed)
Pt fell and hit the back of her head. She saw Dr. Gena Fray 11/03/21 for this issue. She has a plane trip planned for 11/15/21, she is wondering if it's OK for her to fly. Please advise pt '@[phone]'$ [phone]

## 2021-11-06 NOTE — Telephone Encounter (Signed)
Please review and advise. Thanks. Dm/cma  

## 2021-11-07 NOTE — Telephone Encounter (Signed)
Lft VM to rtn call. Dm/cma  

## 2021-11-07 NOTE — Telephone Encounter (Signed)
Patient notified [facility] phone.  No further.  Dm/cma

## 2022-04-14 ENCOUNTER — Ambulatory Visit: Payer: Self-pay

## 2022-04-14 ENCOUNTER — Ambulatory Visit
Admission: RE | Admit: 2022-04-14 | Discharge: 2022-04-14 | Disposition: A | Discharge status: Home / Self Care | Payer: Medicare Other | Source: Ambulatory Visit | Attending: Sports Medicine | Admitting: Sports Medicine

## 2022-04-14 ENCOUNTER — Ambulatory Visit (INDEPENDENT_AMBULATORY_CARE_PROVIDER_SITE_OTHER): Payer: Medicare Other | Admitting: Sports Medicine

## 2022-04-14 VITALS — BP 124/76 | Ht 62.5 in | Wt 125.0 lb

## 2022-04-14 DIAGNOSIS — M79672 Pain in left foot: Secondary | ICD-10-CM | POA: Insufficient documentation

## 2022-04-14 DIAGNOSIS — M25571 Pain in right ankle and joints of right foot: Secondary | ICD-10-CM

## 2022-04-14 MED ORDER — MELOXICAM 15 MG PO TABS: ORAL_TABLET | ORAL | 2 refills | Status: AC

## 2022-04-14 NOTE — Assessment & Plan Note (Addendum)
Suspicious for sesamoiditis versus stress fracture of the sesamoid bones.  Patient was placed in green insoles with a dancer pad to cushion sesamoids under her great toe.  Patient ambulated about the room and stated this felt much more comfortable.  Will try with conservative measures at this time.  Follow-up in 4 weeks.  If her pain continues or worsens we can consider x-ray or MRI for further evaluation if needed.

## 2022-04-14 NOTE — Assessment & Plan Note (Addendum)
Patient's clinical history is somewhat suspicious for an OCD.  There is no obvious findings seen on ultrasound today.  She was sent for x-ray evaluation 3 views of the right ankle. She was fitted for 4 insoles to provide some cushion and support for her right ankle.  Recommend she try compression sleeve on her ankle to help alleviate some of her pain.  I have also sent to her pharmacy meloxicam 15 mg daily to help with pain and inflammation.  I counseled her to discontinue any ibuprofen use while she is using meloxicam and that she may use Tylenol as needed.  She verbalized understanding. Pending x-ray results this may alter our plan however we will call her with those results.

## 2022-04-14 NOTE — Progress Notes (Addendum)
Established Patient Office Visit  Subjective   Patient ID: Morgan Salinas, female    DOB: 06-18-1950  Age: 72 y.o. MRN: [id number]  Right ankle pain, left foot pain chronic.  Patient is here today with 2 separate complaints, the first is a chronic pain under the ball of her left great toe.  This pain has been bothering her for over a year.  She noticed it most when she was training and completed the season she, and 60 mile walk in 1 weekend and.  She was able to complete this challenge however she required a lot of ibuprofen and rest afterwards.  Since then she still notices the pain especially when getting up first thing in the mornings or beginning to walk.  The pain seems to ease off during the activity but then she has to ice afterwards if the pain returns even more severe.  She denies any injury to the area.  She has tried new shoes since switching orthotics.  She finds most comfort in her new balance shoes with original insoles For her right ankle she reports on Thursday she had an acute episode of pain when she was getting up from her desk at work and turned to the left but felt like her ankle stayed planted.  She had a very sharp pain and felt like her foot was locked and she could not move her ankle up or down.  This caused her to hobble for a couple of days.  She has been trying topical Voltaren but discontinued this on Monday.  She does get some relief from ice however it is only temporary.  She did experience this sensation once before in the past when driving and she felt like her ankle locked up when getting out of the car however that resolved on its own.  She denies any injuries to that ankle in the past.   ROS as listed above in HPI    Objective:     BP 124/76   Ht 5' 2.5" (1.588 m)   Wt 125 lb (56.7 kg)   LMP 03/30/1992   BMI 22.50 kg/m  Physical Exam Vitals reviewed.  Constitutional:      General: She is not in acute distress.    Appearance: Normal appearance. She is  not ill-appearing, toxic-appearing or diaphoretic.  Pulmonary:     Effort: Pulmonary effort is normal.  Neurological:     Mental Status: She is alert.   Right ankle: No obvious deformity.  She does appear to have some edema around the ankle especially when compared to the right.  She has tenderness to palpation along the anterior joint line and palpation to the lateral malleolus.  Today she has full range of motion with plantarflexion and dorsi flexion with some discomfort with dorsi flexion.  Strength 5/5 plantarflexion, dorsiflexion, resisted inversion and eversion.  Distal pulses, dorsalis pedis 2+.  Antalgic gait favoring her right ankle Left foot: No obvious deformity or asymmetry.  She does have some tenderness to palpation of the sesamoids of her great toe.  Full range of motion at the ankle strength 5/5.  Limited ultrasound right ankle The ankle joint was visualized in the medial and lateral compartments.  No obvious joint effusion visualized.  Talar dome was difficult to visualize however there did not appear to be any obvious cortical disruption.  There may be a small calcification posterior to the lateral aspect of the joint line. Tibialis anterior tendon was visualized.  There does seem to  be some hypoechoic fluid surrounding the tendon with hypoechoic changes seen within the fibers in both long and short axis. Impression: No obvious cortical disruption of the ankle joint or effusion.  Small calcification that could represent possible chip fracture or previous injury.  There is evidence of a anterior tibialis Inflammation.  Limited ultrasound right foot. There is appear to be differentiation, separation of the medial sesamoid under the great toe.  No obvious hypoechoic edema surrounding the area. Impression: Sesamoiditis versus sesamoid stress fracture versus bipartite sesamoid Ultrasound and interpretation by Wolfgang Phoenix. Oneida Alar, MD and Doyne Keel. Zelphia Glover, DO       Assessment & Plan:    Problem List Items Addressed This Visit       Other   Pain in joint involving right ankle and foot - Primary    Patient's clinical history is somewhat suspicious for an OCD.  There is no obvious findings seen on ultrasound today.  She was sent for x-ray evaluation 3 views of the right ankle. She was fitted for 4 insoles to provide some cushion and support for her right ankle.  Recommend she try compression sleeve on her ankle to help alleviate some of her pain.  I have also sent to her pharmacy meloxicam 15 mg daily to help with pain and inflammation.  I counseled her to discontinue any ibuprofen use while she is using meloxicam and that she may use Tylenol as needed.  She verbalized understanding. Pending x-ray results this may alter our plan however we will call her with those results.      Relevant Orders   [place] LIMITED JOINT SPACE STRUCTURES LOW RIGHT   DG Ankle Complete Right   Left foot pain    Suspicious for sesamoiditis versus stress fracture of the sesamoid bones.  Patient was placed in green insoles with a dancer pad to cushion sesamoids under her great toe.  Patient ambulated about the room and stated this felt much more comfortable.  Will try with conservative measures at this time.  Follow-up in 4 weeks.  If her pain continues or worsens we can consider x-ray or MRI for further evaluation if needed.       No follow-ups on file.    Elmore Guise, DO  I observed and examined the patient with the [facility] resident and agree with assessment and plan.  Note reviewed and modified by me. XR pending review.  I note mild narrowing of Tibio-talar joint; DJD of first MTP.  Os trigone.  Is she has an OCD not obvious on XR so we will plan conservative care with compression, insole support and trial of meloxicam.  Ila Mcgill, MD  Addend:  I called patient and advised of findings.   She will return for follow up in 4 weeks.  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  KB Fields

## 2022-05-20 ENCOUNTER — Encounter: Payer: Self-pay | Admitting: Family Medicine

## 2022-05-20 LAB — HM MAMMOGRAPHY

## 2022-10-12 ENCOUNTER — Ambulatory Visit: Payer: Medicare Other

## 2022-10-12 VITALS — Ht 62.5 in | Wt 124.0 lb

## 2022-10-12 DIAGNOSIS — Z0000 Encounter for general adult medical examination without abnormal findings: Secondary | ICD-10-CM

## 2022-10-12 NOTE — Progress Notes (Signed)
Subjective:   Morgan Salinas is a 72 y.o. female who presents for Medicare Annual (Subsequent) preventive examination.  Visit Complete: Virtual  I connected with  Morgan Salinas on 10/12/22 by a audio enabled telemedicine application and verified that I am speaking with the correct person using two identifiers.  Patient Location: Home  Provider Location: Office/Clinic  I discussed the limitations of evaluation and management by telemedicine. The patient expressed understanding and agreed to proceed.  Per patient no change in vitals since last visit, unable to obtain new vitals due to telehealth visit     Review of Systems     Cardiac Risk Factors include: advanced age (>13men, >70 women)     Objective:    Today's Vitals   10/12/22 1356  Weight: 124 lb (56.2 kg)  Height: 5' 2.5" (1.588 m)   Body mass index is 22.32 kg/m.     10/12/2022    2:03 PM 10/07/2021    8:23 AM  Advanced Directives  Does Patient Have a Medical Advance Directive? Yes Yes  Type of Estate agent of [place];Living will Healthcare Power of [place];Living will  Copy of Healthcare Power of Attorney in Chart? No - copy requested No - copy requested    Current Medications (verified) Outpatient Encounter Medications as of 10/12/2022  Medication Sig   Ascorbic Acid (VITAMIN C) 1000 MG tablet Take 1,000 mg by mouth daily.   Multiple Vitamin (MULTIVITAMIN ADULT PO)    meloxicam (MOBIC) 15 MG tablet Take one pill a day with food for 7 days and then prn thereafter (Patient not taking: Reported on 10/12/2022)   No facility-administered encounter medications on file as of 10/12/2022.    Allergies (verified) Doxycycline   History: Past Medical History:  Diagnosis Date   Fibroid 1994    Resolved via TAH   Heel fracture 2013   left foot   History of chickenpox    Past Surgical History:  Procedure Laterality Date   ABDOMINAL HYSTERECTOMY  1994   TAH, fibroids, endometiosis    BREAST SURGERY  1977   right breast bx, benign   COLONOSCOPY  12/2010   3 polyps recheck 5 years   Family History  Problem Relation Age of Onset   Colon cancer Father        age 13   Hypertension Mother    Heart disease Mother    Asthma Brother    Social History   Socioeconomic History   Marital status: Married    Spouse name: Not on file   Number of children: 2   Years of education: Not on file   Highest education level: Professional school degree (e.g., MD, DDS, DVM, JD)  Occupational History   Occupation: Clinical research associate  Tobacco Use   Smoking status: Never   Smokeless tobacco: Never  Vaping Use   Vaping status: Never Used  Substance and Sexual Activity   Alcohol use: Yes    Alcohol/week: 7.0 - 8.0 standard drinks of alcohol    Types: 3 - 4 Glasses of wine, 4 Standard drinks or equivalent per week    Comment: 3-4 glasses of wine a week   Drug use: No   Sexual activity: Not Currently    Partners: Male    Birth control/protection: Surgical    Comment: TAH  Other Topics Concern   Not on file  Social History Narrative   Not on file   Social Determinants of Health   Financial Resource Strain: Low Risk  (10/12/2022)  Overall Financial Resource Strain (CARDIA)    Difficulty of Paying Living Expenses: Not hard at all  Food Insecurity: No Food Insecurity (10/12/2022)   Hunger Vital Sign    Worried About Running Out of Food in the Last Year: Never true    Ran Out of Food in the Last Year: Never true  Transportation Needs: No Transportation Needs (10/12/2022)   PRAPARE - Administrator, Civil Service (Medical): No    Lack of Transportation (Non-Medical): No  Physical Activity: Insufficiently Active (10/12/2022)   Exercise Vital Sign    Days of Exercise per Week: 7 days    Minutes of Exercise per Session: 20 min  Stress: Stress Concern Present (10/12/2022)   [employer] of Occupational Health - Occupational Stress Questionnaire    Feeling of Stress : To  some extent  Social Connections: Socially Integrated (10/12/2022)   Social Connection and Isolation Panel [NHANES]    Frequency of Communication with Friends and Family: More than three times a week    Frequency of Social Gatherings with Friends and Family: Once a week    Attends Religious Services: 1 to 4 times per year    Active Member of [employer] or Organizations: Yes    Attends Engineer, structural: More than 4 times per year    Marital Status: Married    Tobacco Counseling Counseling given: Not Answered   Clinical Intake:  Pre-visit preparation completed: Yes  Pain : No/denies pain     Nutritional Status: BMI of 19-24  Normal Nutritional Risks: None Diabetes: No  How often do you need to have someone help you when you read instructions, pamphlets, or other written materials from your doctor or pharmacy?: 1 - Never  Interpreter Needed?: No  Information entered by :: NAllen LPN   Activities of Daily Living    10/12/2022    1:57 PM  In your present state of health, do you have any difficulty performing the following activities:  Hearing? 0  Vision? 1  Comment has cataracts  Difficulty concentrating or making decisions? 0  Walking or climbing stairs? 0  Dressing or bathing? 0  Doing errands, shopping? 0  Preparing Food and eating ? N  Using the Toilet? N  In the past six months, have you accidently leaked urine? N  Do you have problems with loss of bowel control? N  Managing your Medications? N  Managing your Finances? N  Housekeeping or managing your Housekeeping? N    Patient Care Team: Loyola Mast, MD as PCP - General (Family Medicine) Ardell Isaacs, Forrestine Him, MD as Consulting Physician (Obstetrics and Gynecology) Charna Elizabeth, MD as Consulting Physician (Gastroenterology)  Indicate any recent Medical Services you may have received from other than [facility] providers in the past year (date may be approximate).     Assessment:   This is a  routine wellness examination for Morgan Salinas.  Hearing/Vision screen Hearing Screening - Comments:: Denies hearing issues Vision Screening - Comments:: Regular eye exams, [facility]  Dietary issues and exercise activities discussed:     Goals Addressed             This Visit's Progress    Patient Stated       10/12/2022, wants to maintain muscle tone       Depression Screen    10/12/2022    2:05 PM 10/07/2021    8:25 AM 10/07/2021    8:22 AM 09/18/2020    8:22 AM 04/13/2017  12:14 PM  PHQ 2/9 Scores  PHQ - 2 Score 0 0 0 0 0  PHQ- 9 Score 0        Fall Risk    10/12/2022    2:04 PM 10/07/2021    8:24 AM 09/18/2020    8:22 AM 04/13/2017   12:14 PM  Fall Risk   Falls in the past year? 1 0 0 No  Comment tripped     Number falls in past yr: 0 0 0   Injury with Fall? 0 0 0   Risk for fall due to : No Fall Risks No Fall Risks    Follow up Falls prevention discussed;Falls evaluation completed Falls evaluation completed;Education provided      MEDICARE RISK AT HOME:  Medicare Risk at Home - 10/12/22 1405     Any stairs in or around the home? Yes    If so, are there any without handrails? No    Home free of loose throw rugs in walkways, pet beds, electrical cords, etc? Yes    Adequate lighting in your home to reduce risk of falls? Yes    Life alert? No    Use of a cane, walker or w/c? No    Grab bars in the bathroom? No    Shower chair or bench in shower? No    Elevated toilet seat or a handicapped toilet? No             TIMED UP AND GO:  Was the test performed?  No    Cognitive Function:        10/12/2022    2:06 PM  6CIT Screen  What Year? 0 points  What month? 0 points  What time? 0 points  Count back from 20 0 points  Months in reverse 0 points  Repeat phrase 0 points  Total Score 0 points    Immunizations Immunization History  Administered Date(s) Administered   DTaP 04/09/2009   Fluad Quad(high Dose 65+) 04/04/2021   Moderna Sars-Covid-2  Vaccination 04/28/2019, 06/02/2019, 03/08/2020   Tdap 11/14/2015    TDAP status: Up to date  Flu Vaccine status: Up to date  Pneumococcal vaccine status: Due, Education has been provided regarding the importance of this vaccine. Advised may receive this vaccine at local pharmacy or Health Dept. Aware to provide a copy of the vaccination record if obtained from local pharmacy or Health Dept. Verbalized acceptance and understanding.  Covid-19 vaccine status: Information provided on how to obtain vaccines.   Qualifies for Shingles Vaccine? Yes   Zostavax completed No   Shingrix Completed?: No.    Education has been provided regarding the importance of this vaccine. Patient has been advised to call insurance company to determine out of pocket expense if they have not yet received this vaccine. Advised may also receive vaccine at local pharmacy or Health Dept. Verbalized acceptance and understanding.  Screening Tests Health Maintenance  Topic Date Due   Hepatitis C Screening  Never done   Zoster Vaccines- Shingrix (1 of 2) Never done   Pneumonia Vaccine 60+ Years old (1 of 1 - PCV) Never done   COVID-19 Vaccine (4 - 2023-24 season) 11/28/2021   INFLUENZA VACCINE  10/29/2022   MAMMOGRAM  05/21/2023   Medicare Annual Wellness (AWV)  10/12/2023   DTaP/Tdap/Td (3 - Td or Tdap) 11/13/2025   Colonoscopy  05/09/2026   DEXA SCAN  Completed   HPV VACCINES  Aged Out    Health Maintenance  Health Maintenance Due  Topic Date Due   Hepatitis C Screening  Never done   Zoster Vaccines- Shingrix (1 of 2) Never done   Pneumonia Vaccine 35+ Years old (1 of 1 - PCV) Never done   COVID-19 Vaccine (4 - 2023-24 season) 11/28/2021    Colorectal cancer screening: Type of screening: Colonoscopy. Completed 05/09/2021. Repeat every 5 years  Mammogram status: Completed 05/20/2022. Repeat every year  Bone Density status: Completed 09/25/2014.   Lung Cancer Screening: (Low Dose CT Chest recommended if Age  87-80 years, 20 pack-year currently smoking OR have quit w/in 15years.) does not qualify.   Lung Cancer Screening Referral: no  Additional Screening:  Hepatitis C Screening: does qualify;   Vision Screening: Recommended annual ophthalmology exams for early detection of glaucoma and other disorders of the eye. Is the patient up to date with their annual eye exam?  Yes  Who is the provider or what is the name of the office in which the patient attends annual eye exams? [facility] If pt is not established with a provider, would they like to be referred to a provider to establish care? Yes .   Dental Screening: Recommended annual dental exams for proper oral hygiene  Diabetic Foot Exam: n/a  Community Resource Referral / Chronic Care Management: CRR required this visit?  No   CCM required this visit?  No     Plan:     I have personally reviewed and noted the following in the patient's chart:   Medical and social history Use of alcohol, tobacco or illicit drugs  Current medications and supplements including opioid prescriptions. Patient is not currently taking opioid prescriptions. Functional ability and status Nutritional status Physical activity Advanced directives List of other physicians Hospitalizations, surgeries, and ER visits in previous 12 months Vitals Screenings to include cognitive, depression, and falls Referrals and appointments  In addition, I have reviewed and discussed with patient certain preventive protocols, quality metrics, and best practice recommendations. A written personalized care plan for preventive services as well as general preventive health recommendations were provided to patient.     Barb Merino, LPN   07/06/8117   After Visit Summary: (MyChart) Due to this being a telephonic visit, the after visit summary with patients personalized plan was offered to patient via MyChart   Nurse Notes: none

## 2022-10-12 NOTE — Patient Instructions (Signed)
Morgan Salinas , Thank you for taking time to come for your Medicare Wellness Visit. I appreciate your ongoing commitment to your health goals. Please review the following plan we discussed and let me know if I can assist you in the future.   These are the goals we discussed:  Goals      Patient Stated     10/12/2022, wants to maintain muscle tone        This is a list of the screening recommended for you and due dates:  Health Maintenance  Topic Date Due   Hepatitis C Screening  Never done   Zoster (Shingles) Vaccine (1 of 2) Never done   Pneumonia Vaccine (1 of 1 - PCV) Never done   COVID-19 Vaccine (4 - 2023-24 season) 11/28/2021   Flu Shot  10/29/2022   Mammogram  05/21/2023   Medicare Annual Wellness Visit  10/12/2023   DTaP/Tdap/Td vaccine (3 - Td or Tdap) 11/13/2025   Colon Cancer Screening  05/09/2026   DEXA scan (bone density measurement)  Completed   HPV Vaccine  Aged Out    Advanced directives: Please bring a copy of your POA (Power of [place]) and/or Living Will to your next appointment.   Conditions/risks identified: none  Next appointment: Follow up in one year for your annual wellness visit    Preventive Care 65 Years and Older, Female Preventive care refers to lifestyle choices and visits with your health care provider that can promote health and wellness. What does preventive care include? A yearly physical exam. This is also called an annual well check. Dental exams once or twice a year. Routine eye exams. Ask your health care provider how often you should have your eyes checked. Personal lifestyle choices, including: Daily care of your teeth and gums. Regular physical activity. Eating a healthy diet. Avoiding tobacco and drug use. Limiting alcohol use. Practicing safe sex. Taking low-dose aspirin every day. Taking vitamin and mineral supplements as recommended by your health care provider. What happens during an annual well check? The services and  screenings done by your health care provider during your annual well check will depend on your age, overall health, lifestyle risk factors, and family history of disease. Counseling  Your health care provider may ask you questions about your: Alcohol use. Tobacco use. Drug use. Emotional well-being. Home and relationship well-being. Sexual activity. Eating habits. History of falls. Memory and ability to understand (cognition). Work and work Astronomer. Reproductive health. Screening  You may have the following tests or measurements: Height, weight, and BMI. Blood pressure. Lipid and cholesterol levels. These may be checked every 5 years, or more frequently if you are over 69 years old. Skin check. Lung cancer screening. You may have this screening every year starting at age 78 if you have a 30-pack-year history of smoking and currently smoke or have quit within the past 15 years. Fecal occult blood test (FOBT) of the stool. You may have this test every year starting at age 63. Flexible sigmoidoscopy or colonoscopy. You may have a sigmoidoscopy every 5 years or a colonoscopy every 10 years starting at age 53. Hepatitis C blood test. Hepatitis B blood test. Sexually transmitted disease (STD) testing. Diabetes screening. This is done by checking your blood sugar (glucose) after you have not eaten for a while (fasting). You may have this done every 1-3 years. Bone density scan. This is done to screen for osteoporosis. You may have this done starting at age 51. Mammogram. This may be  done every 1-2 years. Talk to your health care provider about how often you should have regular mammograms. Talk with your health care provider about your test results, treatment options, and if necessary, the need for more tests. Vaccines  Your health care provider may recommend certain vaccines, such as: Influenza vaccine. This is recommended every year. Tetanus, diphtheria, and acellular pertussis (Tdap,  Td) vaccine. You may need a Td booster every 10 years. Zoster vaccine. You may need this after age 45. Pneumococcal 13-valent conjugate (PCV13) vaccine. One dose is recommended after age 67. Pneumococcal polysaccharide (PPSV23) vaccine. One dose is recommended after age 47. Talk to your health care provider about which screenings and vaccines you need and how often you need them. This information is not intended to replace advice given to you by your health care provider. Make sure you discuss any questions you have with your health care provider. Document Released: 04/12/2015 Document Revised: 12/04/2015 Document Reviewed: 01/15/2015 Elsevier Interactive Patient Education  2017 [employer].  Fall Prevention in the Home Falls can cause injuries. They can happen to people of all ages. There are many things you can do to make your home safe and to help prevent falls. What can I do on the outside of my home? Regularly fix the edges of walkways and driveways and fix any cracks. Remove anything that might make you trip as you walk through a door, such as a raised step or threshold. Trim any bushes or trees on the path to your home. Use bright outdoor lighting. Clear any walking paths of anything that might make someone trip, such as rocks or tools. Regularly check to see if handrails are loose or broken. Make sure that both sides of any steps have handrails. Any raised decks and porches should have guardrails on the edges. Have any leaves, snow, or ice cleared regularly. Use sand or salt on walking paths during winter. Clean up any spills in your garage right away. This includes oil or grease spills. What can I do in the bathroom? Use night lights. Install grab bars by the toilet and in the tub and shower. Do not use towel bars as grab bars. Use non-skid mats or decals in the tub or shower. If you need to sit down in the shower, use a plastic, non-slip stool. Keep the floor dry. Clean up any  water that spills on the floor as soon as it happens. Remove soap buildup in the tub or shower regularly. Attach bath mats securely with double-sided non-slip rug tape. Do not have throw rugs and other things on the floor that can make you trip. What can I do in the bedroom? Use night lights. Make sure that you have a light by your bed that is easy to reach. Do not use any sheets or blankets that are too big for your bed. They should not hang down onto the floor. Have a firm chair that has side arms. You can use this for support while you get dressed. Do not have throw rugs and other things on the floor that can make you trip. What can I do in the kitchen? Clean up any spills right away. Avoid walking on wet floors. Keep items that you use a lot in easy-to-reach places. If you need to reach something above you, use a strong step stool that has a grab bar. Keep electrical cords out of the way. Do not use floor polish or wax that makes floors slippery. If you must use wax,  use non-skid floor wax. Do not have throw rugs and other things on the floor that can make you trip. What can I do with my stairs? Do not leave any items on the stairs. Make sure that there are handrails on both sides of the stairs and use them. Fix handrails that are broken or loose. Make sure that handrails are as long as the stairways. Check any carpeting to make sure that it is firmly attached to the stairs. Fix any carpet that is loose or worn. Avoid having throw rugs at the top or bottom of the stairs. If you do have throw rugs, attach them to the floor with carpet tape. Make sure that you have a light switch at the top of the stairs and the bottom of the stairs. If you do not have them, ask someone to add them for you. What else can I do to help prevent falls? Wear shoes that: Do not have high heels. Have rubber bottoms. Are comfortable and fit you well. Are closed at the toe. Do not wear sandals. If you use a  stepladder: Make sure that it is fully opened. Do not climb a closed stepladder. Make sure that both sides of the stepladder are locked into place. Ask someone to hold it for you, if possible. Clearly mark and make sure that you can see: Any grab bars or handrails. First and last steps. Where the edge of each step is. Use tools that help you move around (mobility aids) if they are needed. These include: Canes. Walkers. Scooters. Crutches. Turn on the lights when you go into a dark area. Replace any light bulbs as soon as they burn out. Set up your furniture so you have a clear path. Avoid moving your furniture around. If any of your floors are uneven, fix them. If there are any pets around you, be aware of where they are. Review your medicines with your doctor. Some medicines can make you feel dizzy. This can increase your chance of falling. Ask your doctor what other things that you can do to help prevent falls. This information is not intended to replace advice given to you by your health care provider. Make sure you discuss any questions you have with your health care provider. Document Released: 01/10/2009 Document Revised: 08/22/2015 Document Reviewed: 04/20/2014 Elsevier Interactive Patient Education  2017 [employer].

## 2023-05-18 ENCOUNTER — Telehealth (INDEPENDENT_AMBULATORY_CARE_PROVIDER_SITE_OTHER): Payer: Medicare Other | Admitting: Nurse Practitioner

## 2023-05-18 ENCOUNTER — Encounter: Payer: Self-pay | Admitting: Nurse Practitioner

## 2023-05-18 DIAGNOSIS — J0100 Acute maxillary sinusitis, unspecified: Secondary | ICD-10-CM | POA: Diagnosis not present

## 2023-05-18 MED ORDER — AMOXICILLIN-POT CLAVULANATE 875-125 MG PO TABS: 1.0000 | ORAL_TABLET | Freq: Two times a day (BID) | ORAL | 0 refills | Status: AC

## 2023-05-18 NOTE — Progress Notes (Signed)
[facility]IMARY [facility]ARE [facility] [address] [place] [address] Dept: [phone] Dept Fax: [phone]  Virtual Video Visit  I connected with Morgan Salinas on 05/18/23 at  8:20 AM EST by a video enabled telemedicine application and verified that I am speaking with the correct person using two identifiers.  Location patient: Home Location provider: Clinic Persons participating in the virtual visit: Patient; Morgan Pickle, NP; Morgan Salinas, CMA  I discussed the limitations of evaluation and management by telemedicine and the availability of in person appointments. The patient expressed understanding and agreed to proceed.  Chief Complaint  Patient presents with   Acute Visit    PT C/O of congestion, cough, headache and sinus drainage for 2 weeks. PT used lemon/tea, vitamin C. Zinc and Advil for symptoms; excuse work note is needed. Patient appetite is good with no sore throat or body aches present.     SUBJECTIVE:  HPI: Morgan Salinas is a 73 y.o. female who presents with congestion, headache, and sinus pain for 2 weeks.   UPPER RESPIRATORY TRACT INFECTION  Fever: no Cough: yes - congested Shortness of breath: no Wheezing: no Chest pain: no Chest tightness: no Chest congestion: yes Nasal congestion: yes Runny nose: no Post nasal drip: yes Sneezing: no Sore throat: no Swollen glands: no Sinus pressure: yes Headache: yes Face pain: yes Toothache: no Ear pain: no bilateral Ear pressure: no bilateral Rash: no Fatigue: yes Sick contacts: no Strep contacts: no  Context: fluctuating Recurrent sinusitis: no Relief with OTC cold/cough medications: yes  Treatments attempted: zinc, vitamin C, cough drop, tea    Patient Active Problem List   Diagnosis Date Noted   Pain in joint involving right ankle and foot 04/14/2022   Left foot pain 04/14/2022   History of colonic polyps 09/18/2020   Borderline hyperlipidemia 09/18/2020    Bunion of right foot 04/18/2008    Past Surgical History:  Procedure Laterality Date   ABDOMINAL HYSTERECTOMY  1994   TAH, fibroids, endometiosis   BREAST SURGERY  1977   right breast bx, benign   COLONOSCOPY  12/2010   3 polyps recheck 5 years    Family History  Problem Relation Age of Onset   Colon cancer Father        age 61   Hypertension Mother    Heart disease Mother    Asthma Brother     Social History   Tobacco Use   Smoking status: Never   Smokeless tobacco: Never  Vaping Use   Vaping status: Never Used  Substance Use Topics   Alcohol use: Yes    Alcohol/week: 7.0 - 8.0 standard drinks of alcohol    Types: 3 - 4 Glasses of wine, 4 Standard drinks or equivalent per week    Comment: 3-4 glasses of wine a week   Drug use: No     Current Outpatient Medications:    amoxicillin-clavulanate (AUGMENTIN) 875-125 MG tablet, Take 1 tablet by mouth 2 (two) times daily., Disp: 20 tablet, Rfl: 0   Ascorbic Acid (VITAMIN C) 1000 MG tablet, Take 1,000 mg by mouth daily., Disp: , Rfl:    Multiple Vitamin (MULTIVITAMIN ADULT PO), , Disp: , Rfl:    meloxicam (MOBIC) 15 MG tablet, Take one pill a day with food for 7 days and then prn thereafter (Patient not taking: Reported on 10/12/2022), Disp: 40 tablet, Rfl: 2  Allergies  Allergen Reactions   Doxycycline Rash    Not sure it was the  doxycycline that caused the rash    ROS: See pertinent positives and negatives per HPI.  OBSERVATIONS/OBJECTIVE:  VITALS per patient if applicable: There were no vitals filed for this visit. There is no height or weight on file to calculate BMI.    GENERAL: Alert and oriented. Appears well and in no acute distress.  HEENT: Atraumatic. Conjunctiva clear. No obvious abnormalities on inspection of external nose and ears.  NECK: Normal movements of the head and neck.  LUNGS: On inspection, no signs of respiratory distress. Breathing rate appears normal. No obvious gross SOB, gasping  or wheezing, and no conversational dyspnea.  CV: No obvious cyanosis.  MS: Moves all visible extremities without noticeable abnormality.  PSYCH/NEURO: Pleasant and cooperative. No obvious depression or anxiety. Speech and thought processing grossly intact.  ASSESSMENT AND PLAN:  Problem List Items Addressed This Visit   None Visit Diagnoses       Acute non-recurrent maxillary sinusitis    -  Primary   Start augmentin BID x10 days. Encourage fluids, rest. Can continue OTC meds and start mucinex to help with congestion. F/U if not improving.   Relevant Medications   amoxicillin-clavulanate (AUGMENTIN) 875-125 MG tablet        I discussed the assessment and treatment plan with the patient. The patient was provided an opportunity to ask questions and all were answered. The patient agreed with the plan and demonstrated an understanding of the instructions.   The patient was advised to call back or seek an in-person evaluation if the symptoms worsen or if the condition fails to improve as anticipated.   Gerre Scull, NP

## 2023-05-18 NOTE — Patient Instructions (Signed)
It was great to see you!  Start augmentin twice a day with food  Drink plenty of fluids and get rest  You can try over the counter mucinex to help with your congestion.   Let's follow-up if your symptoms worsen or don't improve.   Take care,  Rodman Pickle, NP

## 2023-05-26 ENCOUNTER — Encounter: Payer: Self-pay | Admitting: Family Medicine

## 2023-05-26 LAB — HM MAMMOGRAPHY

## 2023-10-18 ENCOUNTER — Ambulatory Visit (INDEPENDENT_AMBULATORY_CARE_PROVIDER_SITE_OTHER)

## 2023-10-18 DIAGNOSIS — Z0000 Encounter for general adult medical examination without abnormal findings: Secondary | ICD-10-CM | POA: Diagnosis not present

## 2023-10-18 NOTE — Progress Notes (Signed)
 Subjective:   Morgan Salinas is a 73 y.o. who presents for a Medicare Wellness preventive visit.  As a reminder, Annual Wellness Visits don't include a physical exam, and some assessments may be limited, especially if this visit is performed virtually. We may recommend an in-person follow-up visit with your provider if needed.  Visit Complete: Virtual I connected with  Morgan Salinas Knee on 10/18/23 by a audio enabled telemedicine application and verified that I am speaking with the correct person using two identifiers.  Patient Location: Home  Provider Location: Office/Clinic  I discussed the limitations of evaluation and management by telemedicine. The patient expressed understanding and agreed to proceed.  Vital Signs: Because this visit was a virtual/telehealth visit, some criteria may be missing or patient reported. Any vitals not documented were not able to be obtained and vitals that have been documented are patient reported.  VideoError- Librarian, academic were attempted between this provider and patient, however failed, due to patient having technical difficulties OR patient did not have access to video capability.  We continued and completed visit with audio only.   Persons Participating in Visit: Patient.  AWV Questionnaire: Yes: Patient Medicare AWV questionnaire was completed by the patient on 10/14/2023; I have confirmed that all information answered by patient is correct and no changes since this date.  Cardiac Risk Factors include: advanced age (>79men, >51 women)     Objective:    Today's Vitals   There is no height or weight on file to calculate BMI.     10/18/2023    2:22 PM 10/12/2022    2:03 PM 10/07/2021    8:23 AM  Advanced Directives  Does Patient Have a Medical Advance Directive? Yes Yes Yes  Type of Estate agent of [place];Living will Healthcare Power of [place];Living will Healthcare Power of  [place];Living will  Copy of Healthcare Power of Attorney in Chart? No - copy requested No - copy requested No - copy requested    Current Medications (verified) Outpatient Encounter Medications as of 10/18/2023  Medication Sig   Multiple Vitamin (MULTIVITAMIN ADULT PO)    amoxicillin -clavulanate (AUGMENTIN ) 875-125 MG tablet Take 1 tablet by mouth 2 (two) times daily.   Ascorbic Acid (VITAMIN C) 1000 MG tablet Take 1,000 mg by mouth daily. (Patient not taking: Reported on 10/18/2023)   meloxicam  (MOBIC ) 15 MG tablet Take one pill a day with food for 7 days and then prn thereafter (Patient not taking: Reported on 10/12/2022)   No facility-administered encounter medications on file as of 10/18/2023.    Allergies (verified) Doxycycline   History: Past Medical History:  Diagnosis Date   Fibroid 1994    Resolved via TAH   Heel fracture 2013   left foot   History of chickenpox    Past Surgical History:  Procedure Laterality Date   ABDOMINAL HYSTERECTOMY  1994   TAH, fibroids, endometiosis   BREAST SURGERY  1977   right breast bx, benign   COLONOSCOPY  12/2010   3 polyps recheck 5 years   Family History  Problem Relation Age of Onset   Colon cancer Father        age 29   Hypertension Mother    Heart disease Mother    Asthma Brother    Social History   Socioeconomic History   Marital status: Married    Spouse name: Not on file   Number of children: 2   Years of education: Not on file  Highest education level: Professional school degree (e.g., MD, DDS, DVM, JD)  Occupational History   Occupation: Lawyer  Tobacco Use   Smoking status: Never   Smokeless tobacco: Never  Vaping Use   Vaping status: Never Used  Substance and Sexual Activity   Alcohol use: Yes    Alcohol/week: 7.0 - 8.0 standard drinks of alcohol    Types: 3 - 4 Glasses of wine, 4 Standard drinks or equivalent per week    Comment: 3-4 glasses of wine a week   Drug use: No   Sexual activity: Not  Currently    Partners: Male    Birth control/protection: Surgical    Comment: TAH  Other Topics Concern   Not on file  Social History Narrative   Not on file   Social Drivers of Health   Financial Resource Strain: Low Risk  (10/14/2023)   Overall Financial Resource Strain (CARDIA)    Difficulty of Paying Living Expenses: Not very hard  Food Insecurity: No Food Insecurity (10/14/2023)   Hunger Vital Sign    Worried About Running Out of Food in the Last Year: Never true    Ran Out of Food in the Last Year: Never true  Transportation Needs: No Transportation Needs (10/14/2023)   PRAPARE - Administrator, Civil Service (Medical): No    Lack of Transportation (Non-Medical): No  Physical Activity: Insufficiently Active (10/14/2023)   Exercise Vital Sign    Days of Exercise per Week: 3 days    Minutes of Exercise per Session: 30 min  Stress: No Stress Concern Present (10/14/2023)   [employer] of Occupational Health - Occupational Stress Questionnaire    Feeling of Stress: Only a little  Social Connections: Unknown (10/14/2023)   Social Connection and Isolation Panel    Frequency of Communication with Friends and Family: More than three times a week    Frequency of Social Gatherings with Friends and Family: Once a week    Attends Religious Services: More than 4 times per year    Active Member of [employer] or Organizations: Yes    Attends Banker Meetings: 1 to 4 times per year    Marital Status: Patient declined    Tobacco Counseling Counseling given: Not Answered    Clinical Intake:  Pre-visit preparation completed: Yes  Pain : No/denies pain     Diabetes: No  No results found for: HGBA1C   How often do you need to have someone help you when you read instructions, pamphlets, or other written materials from your doctor or pharmacy?: 1 - Never  Interpreter Needed?: No  Information entered by :: NAllen LPN   Activities of Daily Living      10/14/2023   11:43 AM 10/11/2023   10:46 AM  In your present state of health, do you have any difficulty performing the following activities:  Hearing? 0 0  Vision? 0 1  Difficulty concentrating or making decisions? 0 0  Walking or climbing stairs? 0 0  Dressing or bathing? 0 0  Doing errands, shopping? 0 0  Preparing Food and eating ? N N  Using the Toilet? N   In the past six months, have you accidently leaked urine? N N  Do you have problems with loss of bowel control? N N  Managing your Medications? N N  Managing your Finances? N N  Housekeeping or managing your Housekeeping? N N    Patient Care Team: Thedora Garnette HERO, MD as PCP - General (Family  Medicine) Cathlyn JAYSON Nikki Bobie FORBES, MD as Consulting Physician (Obstetrics and Gynecology) Kristie Lamprey, MD as Consulting Physician (Gastroenterology)  I have updated your Care Teams any recent Medical Services you may have received from other providers in the past year.     Assessment:   This is a routine wellness examination for Djuna.  Hearing/Vision screen Hearing Screening - Comments:: Denies Hearing issues Vision Screening - Comments:: Regular eye exams, [facility]   Goals Addressed             This Visit's Progress    Patient Stated       10/18/2023, wants to increase mileage with walking       Depression Screen     10/18/2023    2:25 PM 10/12/2022    2:05 PM 10/07/2021    8:25 AM 10/07/2021    8:22 AM 09/18/2020    8:22 AM 04/13/2017   12:14 PM  PHQ 2/9 Scores  PHQ - 2 Score 0 0 0 0 0 0  PHQ- 9 Score 1 0        Fall Risk     10/14/2023   11:43 AM 10/11/2023   10:46 AM 10/12/2022    2:04 PM 10/07/2021    8:24 AM 09/18/2020    8:22 AM  Fall Risk   Falls in the past year? 1 1 1  0 0  Comment fell going down stairs  tripped    Number falls in past yr: 0 0 0 0 0  Injury with Fall? 0  0 0 0  Risk for fall due to : No Fall Risks  No Fall Risks No Fall Risks   Follow up Falls prevention discussed;Falls  evaluation completed  Falls prevention discussed;Falls evaluation completed Falls evaluation completed;Education provided       Data saved with a previous flowsheet row definition    MEDICARE RISK AT HOME:  Medicare Risk at Home Any stairs in or around the home?: (Patient-Rptd) Yes If so, are there any without handrails?: (Patient-Rptd) No Home free of loose throw rugs in walkways, pet beds, electrical cords, etc?: (Patient-Rptd) Yes Adequate lighting in your home to reduce risk of falls?: (Patient-Rptd) Yes Life alert?: (Patient-Rptd) No Use of a cane, walker or w/c?: (Patient-Rptd) No Grab bars in the bathroom?: (Patient-Rptd) No Shower chair or bench in shower?: (Patient-Rptd) No Elevated toilet seat or a handicapped toilet?: (Patient-Rptd) No  TIMED UP AND GO:  Was the test performed?  No  Cognitive Function: 6CIT completed        10/18/2023    2:26 PM 10/12/2022    2:06 PM  6CIT Screen  What Year? 0 points 0 points  What month? 0 points 0 points  What time? 0 points 0 points  Count back from 20 0 points 0 points  Months in reverse 0 points 0 points  Repeat phrase 0 points 0 points  Total Score 0 points 0 points    Immunizations Immunization History  Administered Date(s) Administered   DTaP 04/09/2009   Fluad Quad(high Dose 65+) 04/04/2021   Moderna Sars-Covid-2 Vaccination 04/28/2019, 06/02/2019, 03/08/2020   Tdap 11/14/2015    Screening Tests Health Maintenance  Topic Date Due   Hepatitis C Screening  Never done   Pneumococcal Vaccine: 50+ Years (1 of 1 - PCV) Never done   Zoster Vaccines- Shingrix (1 of 2) Never done   COVID-19 Vaccine (4 - 2024-25 season) 11/29/2022   INFLUENZA VACCINE  10/29/2023   MAMMOGRAM  05/25/2024  Medicare Annual Wellness (AWV)  10/17/2024   DTaP/Tdap/Td (3 - Td or Tdap) 11/13/2025   Colonoscopy  05/09/2026   DEXA SCAN  Completed   Hepatitis B Vaccines  Aged Out   HPV VACCINES  Aged Out   Meningococcal B Vaccine  Aged Out     Health Maintenance  Health Maintenance Due  Topic Date Due   Hepatitis C Screening  Never done   Pneumococcal Vaccine: 50+ Years (1 of 1 - PCV) Never done   Zoster Vaccines- Shingrix (1 of 2) Never done   COVID-19 Vaccine (4 - 2024-25 season) 11/29/2022   Health Maintenance Items Addressed: Due for pneumonia and shingles vaccines. Due for Hep C screening.  Additional Screening:  Vision Screening: Recommended annual ophthalmology exams for early detection of glaucoma and other disorders of the eye. Would you like a referral to an eye doctor? No    Dental Screening: Recommended annual dental exams for proper oral hygiene  Community Resource Referral / Chronic Care Management: CRR required this visit?  No   CCM required this visit?  No   Plan:    I have personally reviewed and noted the following in the patient's chart:   Medical and social history Use of alcohol, tobacco or illicit drugs  Current medications and supplements including opioid prescriptions. Patient is not currently taking opioid prescriptions. Functional ability and status Nutritional status Physical activity Advanced directives List of other physicians Hospitalizations, surgeries, and ER visits in previous 12 months Vitals Screenings to include cognitive, depression, and falls Referrals and appointments  In addition, I have reviewed and discussed with patient certain preventive protocols, quality metrics, and best practice recommendations. A written personalized care plan for preventive services as well as general preventive health recommendations were provided to patient.   Ardella FORBES Dawn, LPN   [id number]/7974   After Visit Summary: (MyChart) Due to this being a telephonic visit, the after visit summary with patients personalized plan was offered to patient via MyChart   Notes: Nothing significant to report at this time.

## 2023-10-18 NOTE — Patient Instructions (Signed)
 Ms. Bernardini , Thank you for taking time out of your busy schedule to complete your Annual Wellness Visit with me. I enjoyed our conversation and look forward to speaking with you again next year. I, as well as your care team,  appreciate your ongoing commitment to your health goals. Please review the following plan we discussed and let me know if I can assist you in the future. Your Game plan/ To Do List    Referrals: If you haven't heard from the office you've been referred to, please reach out to them at the phone provided.  N/a Follow up Visits: Next Medicare AWV with our clinical staff: 10/20/2024 at 9:30   Have you seen your provider in the last 6 months (3 months if uncontrolled diabetes)? No Next Office Visit with your provider: will call to schedule  Clinician Recommendations:  Aim for 30 minutes of exercise or brisk walking, 6-8 glasses of water, and 5 servings of fruits and vegetables each day.       This is a list of the screening recommended for you and due dates:  Health Maintenance  Topic Date Due   Hepatitis C Screening  Never done   Pneumococcal Vaccine for age over 51 (1 of 1 - PCV) Never done   Zoster (Shingles) Vaccine (1 of 2) Never done   COVID-19 Vaccine (4 - 2024-25 season) 11/29/2022   Flu Shot  10/29/2023   Mammogram  05/25/2024   Medicare Annual Wellness Visit  10/17/2024   DTaP/Tdap/Td vaccine (3 - Td or Tdap) 11/13/2025   Colon Cancer Screening  05/09/2026   DEXA scan (bone density measurement)  Completed   Hepatitis B Vaccine  Aged Out   HPV Vaccine  Aged Out   Meningitis B Vaccine  Aged Out    Advanced directives: (Copy Requested) Please bring a copy of your health care power of attorney and living will to the office to be added to your chart at your convenience. You can mail to [facility] [address] [place], [place] [address] or email to [email or web address] Advance Care Planning is important because it:  [x]  Makes sure you  receive the medical care that is consistent with your values, goals, and preferences  [x]  It provides guidance to your family and loved ones and reduces their decisional burden about whether or not they are making the right decisions based on your wishes.  Follow the link provided in your after visit summary or read over the paperwork we have mailed to you to help you started getting your Advance Directives in place. If you need assistance in completing these, please reach out to us  so that we can help you!  See attachments for Preventive Care and Fall Prevention Tips.

## 2024-02-11 ENCOUNTER — Ambulatory Visit: Payer: Self-pay

## 2024-02-11 NOTE — Telephone Encounter (Signed)
 FYI Only or Action Required?: FYI only for provider: home care advised.  Patient was last seen in primary care on 05/18/2023 by Nedra Tinnie LABOR, NP.  Called Nurse Triage reporting Sore Throat, Nasal Congestion, and Headache.  Symptoms began several days ago.  Interventions attempted: OTC medications: Advil and Rest, hydration, or home remedies.  Symptoms are: stable.  Triage Disposition: Home Care  Patient/caregiver understands and will follow disposition?: Yes                            Copied from CRM #[id number]. Topic: Clinical - Red Word Triage >> Feb 11, 2024 12:42 PM Viola F wrote: Patient has sore throat, congestion and headache - wants to know what medication she can take  Reason for Disposition  [1] Sinus congestion as part of a cold AND [2] present < 10 days  Answer Assessment - Initial Assessment Questions 1. LOCATION: Where does it hurt?      Teeth and sinus headache  2. ONSET: When did the sinus pain start?  (e.g., hours, days)      Monday  3. SEVERITY: How bad is the pain?   (Scale 0-10; or none, mild, moderate or severe)     Mild, rates pain a 2 4. RECURRENT SYMPTOM: Have you ever had sinus problems before? If Yes, ask: When was the last time? and What happened that time?      N/A 5. NASAL CONGESTION: Is the nose blocked? If Yes, ask: Can you open it or must you breathe through your mouth?     Denies difficulty breathing 6. NASAL DISCHARGE: Do you have discharge from your nose? If so ask, What color?     Clear 7. FEVER: Do you have a fever? If Yes, ask: What is it, how was it measured, and when did it start?      Denies 8. OTHER SYMPTOMS: Do you have any other symptoms? (e.g., sore throat, cough, earache, difficulty breathing)     Sore throat, hoarseness, denies redness, denies swelling 9. PREGNANCY: Is there any chance you are pregnant? When was your last menstrual period?    N/A  Protocols used: Sinus  Pain or Congestion-A-AH

## 2024-02-11 NOTE — Telephone Encounter (Signed)
 Noted. Dm/cma

## 2024-10-20 ENCOUNTER — Ambulatory Visit
